# Patient Record
Sex: Female | Born: 1965 | Race: Black or African American | Hispanic: No | Marital: Single | Smoking: Never smoker
Health system: Southern US, Community
[De-identification: ages and names within clinical notes are randomized; demographics above are authoritative.]

## PROBLEM LIST (undated history)

## (undated) DIAGNOSIS — H209 Unspecified iridocyclitis: Secondary | ICD-10-CM

## (undated) DIAGNOSIS — T7840XA Allergy, unspecified, initial encounter: Secondary | ICD-10-CM

## (undated) DIAGNOSIS — S0300XA Dislocation of jaw, unspecified side, initial encounter: Secondary | ICD-10-CM

## (undated) DIAGNOSIS — I10 Essential (primary) hypertension: Secondary | ICD-10-CM

## (undated) DIAGNOSIS — N83209 Unspecified ovarian cyst, unspecified side: Secondary | ICD-10-CM

## (undated) DIAGNOSIS — A159 Respiratory tuberculosis unspecified: Secondary | ICD-10-CM

## (undated) DIAGNOSIS — K5289 Other specified noninfective gastroenteritis and colitis: Secondary | ICD-10-CM

## (undated) DIAGNOSIS — Z800 Family history of malignant neoplasm of digestive organs: Secondary | ICD-10-CM

## (undated) DIAGNOSIS — D509 Iron deficiency anemia, unspecified: Secondary | ICD-10-CM

## (undated) DIAGNOSIS — R7303 Prediabetes: Secondary | ICD-10-CM

## (undated) DIAGNOSIS — K5190 Ulcerative colitis, unspecified, without complications: Secondary | ICD-10-CM

## (undated) DIAGNOSIS — I517 Cardiomegaly: Secondary | ICD-10-CM

## (undated) DIAGNOSIS — U071 COVID-19: Secondary | ICD-10-CM

## (undated) DIAGNOSIS — E669 Obesity, unspecified: Secondary | ICD-10-CM

## (undated) DIAGNOSIS — E538 Deficiency of other specified B group vitamins: Secondary | ICD-10-CM

## (undated) HISTORY — PX: COLONOSCOPY: SHX174

## (undated) HISTORY — DX: Unspecified ovarian cyst, unspecified side: N83.209

## (undated) HISTORY — DX: Obesity, unspecified: E66.9

## (undated) HISTORY — DX: Allergy, unspecified, initial encounter: T78.40XA

## (undated) HISTORY — PX: BARTHOLIN GLAND CYST EXCISION: SHX565

## (undated) HISTORY — DX: Iron deficiency anemia, unspecified: D50.9

## (undated) HISTORY — DX: Essential (primary) hypertension: I10

## (undated) HISTORY — DX: Unspecified iridocyclitis: H20.9

## (undated) HISTORY — DX: Dislocation of jaw, unspecified side, initial encounter: S03.00XA

## (undated) HISTORY — PX: ACHILLES TENDON REPAIR: SUR1153

## (undated) HISTORY — DX: Family history of malignant neoplasm of digestive organs: Z80.0

## (undated) HISTORY — DX: Deficiency of other specified B group vitamins: E53.8

## (undated) HISTORY — DX: Ulcerative colitis, unspecified, without complications: K51.90

## (undated) HISTORY — DX: COVID-19: U07.1

## (undated) HISTORY — DX: Other specified noninfective gastroenteritis and colitis: K52.89

---

## 1990-04-16 HISTORY — PX: BARTHOLIN GLAND CYST EXCISION: SHX565

## 2000-10-07 ENCOUNTER — Other Ambulatory Visit: Admission: RE | Admit: 2000-10-07 | Discharge: 2000-10-07 | Payer: Self-pay | Admitting: Gastroenterology

## 2000-10-07 ENCOUNTER — Encounter (INDEPENDENT_AMBULATORY_CARE_PROVIDER_SITE_OTHER): Payer: Self-pay

## 2004-08-15 ENCOUNTER — Ambulatory Visit: Payer: Self-pay | Admitting: Gastroenterology

## 2004-09-13 ENCOUNTER — Ambulatory Visit: Payer: Self-pay | Admitting: Gastroenterology

## 2004-09-13 ENCOUNTER — Encounter (INDEPENDENT_AMBULATORY_CARE_PROVIDER_SITE_OTHER): Payer: Self-pay | Admitting: *Deleted

## 2004-10-26 ENCOUNTER — Ambulatory Visit: Payer: Self-pay | Admitting: Gastroenterology

## 2005-02-13 ENCOUNTER — Ambulatory Visit: Payer: Self-pay | Admitting: Gastroenterology

## 2006-02-22 ENCOUNTER — Ambulatory Visit: Payer: Self-pay | Admitting: Gastroenterology

## 2006-11-21 ENCOUNTER — Ambulatory Visit: Payer: Self-pay | Admitting: Gastroenterology

## 2007-01-17 ENCOUNTER — Ambulatory Visit: Payer: Self-pay | Admitting: Gastroenterology

## 2007-01-27 ENCOUNTER — Encounter: Payer: Self-pay | Admitting: Gastroenterology

## 2007-01-27 ENCOUNTER — Ambulatory Visit: Payer: Self-pay | Admitting: Gastroenterology

## 2007-01-27 DIAGNOSIS — K5190 Ulcerative colitis, unspecified, without complications: Secondary | ICD-10-CM

## 2007-02-27 ENCOUNTER — Ambulatory Visit: Payer: Self-pay | Admitting: Gastroenterology

## 2007-02-27 LAB — CONVERTED CEMR LAB
Angiotensin 1 Converting Enzyme: 37 units/L (ref 9–67)
Ferritin: 14.3 ng/mL (ref 10.0–291.0)
Folate: 7.7 ng/mL
Iron: 53 ug/dL (ref 42–145)
Saturation Ratios: 12.5 % — ABNORMAL LOW (ref 20.0–50.0)
Sed Rate: 33 mm/hr — ABNORMAL HIGH (ref 0–25)
Transferrin: 304 mg/dL (ref >=212.0)
Vitamin B-12: 222 pg/mL (ref 211–911)

## 2007-06-03 DIAGNOSIS — D509 Iron deficiency anemia, unspecified: Secondary | ICD-10-CM

## 2007-06-03 DIAGNOSIS — H209 Unspecified iridocyclitis: Secondary | ICD-10-CM

## 2007-06-03 DIAGNOSIS — D1803 Hemangioma of intra-abdominal structures: Secondary | ICD-10-CM | POA: Insufficient documentation

## 2007-06-03 DIAGNOSIS — K219 Gastro-esophageal reflux disease without esophagitis: Secondary | ICD-10-CM

## 2007-06-03 DIAGNOSIS — N83209 Unspecified ovarian cyst, unspecified side: Secondary | ICD-10-CM

## 2007-06-03 DIAGNOSIS — E669 Obesity, unspecified: Secondary | ICD-10-CM

## 2007-06-03 DIAGNOSIS — K5289 Other specified noninfective gastroenteritis and colitis: Secondary | ICD-10-CM | POA: Insufficient documentation

## 2007-06-03 DIAGNOSIS — E538 Deficiency of other specified B group vitamins: Secondary | ICD-10-CM

## 2007-08-18 ENCOUNTER — Telehealth: Payer: Self-pay | Admitting: Gastroenterology

## 2008-10-01 ENCOUNTER — Encounter: Payer: Self-pay | Admitting: Gastroenterology

## 2008-11-11 ENCOUNTER — Telehealth: Payer: Self-pay | Admitting: Gastroenterology

## 2008-11-30 ENCOUNTER — Ambulatory Visit: Payer: Self-pay | Admitting: Gastroenterology

## 2008-11-30 DIAGNOSIS — I10 Essential (primary) hypertension: Secondary | ICD-10-CM | POA: Insufficient documentation

## 2010-04-11 ENCOUNTER — Telehealth: Payer: Self-pay | Admitting: Gastroenterology

## 2010-04-11 ENCOUNTER — Encounter: Payer: Self-pay | Admitting: Gastroenterology

## 2010-04-28 ENCOUNTER — Ambulatory Visit
Admission: RE | Admit: 2010-04-28 | Discharge: 2010-04-28 | Payer: Self-pay | Source: Home / Self Care | Attending: Gastroenterology | Admitting: Gastroenterology

## 2010-04-28 ENCOUNTER — Other Ambulatory Visit: Payer: Self-pay | Admitting: Gastroenterology

## 2010-04-28 LAB — URINALYSIS, ROUTINE W REFLEX MICROSCOPIC
Bilirubin Urine: NEGATIVE
Hemoglobin, Urine: NEGATIVE
Ketones, ur: NEGATIVE
Leukocytes, UA: NEGATIVE
Nitrite: NEGATIVE
Specific Gravity, Urine: 1.01 (ref 1.000–1.030)
Total Protein, Urine: NEGATIVE
Urine Glucose: NEGATIVE
Urobilinogen, UA: 0.2 (ref 0.0–1.0)
pH: 7 (ref 5.0–8.0)

## 2010-05-18 NOTE — Assessment & Plan Note (Signed)
Summary: med refill---ch.    History of Present Illness Visit Type: Follow-up Visit Primary GI MD: Verl Blalock MD [place] Primary Provider: [name], MD Requesting Provider: na Chief Complaint: F/u for ulcerative colitis.  Pt states that she is doing great and denies any GI complaints  History of Present Illness:   ulcerative colitis in remission on Colazal 3 tablets twice a day. She denies any GI complaints or general medical problems. Extensive labs from Dr.McManus reviewed show a normal CBC and metabolic parameters. She denies any cardiovascular, pulmonary, genitourinary problems. She previously was B12 deficient at current levels of iron and B12 are normal.   GI Review of Systems      Denies abdominal pain, acid reflux, belching, bloating, chest pain, dysphagia with liquids, dysphagia with solids, heartburn, loss of appetite, nausea, vomiting, vomiting blood, weight loss, and  weight gain.        Denies anal fissure, black tarry stools, change in bowel habit, constipation, diarrhea, diverticulosis, fecal incontinence, heme positive stool, hemorrhoids, irritable bowel syndrome, jaundice, light color stool, liver problems, rectal bleeding, and  rectal pain.    Current Medications (verified): 1)  Balsalazide Disodium 750 Mg  Caps (Balsalazide Disodium) .... Take Three Tabs Two Times A Day....must Have Office Visit 2)  Amlodipine Besylate 5 Mg Tabs (Amlodipine Besylate) .... Take 1 Tab Once Daily 3)  Benazepril Hcl 40 Mg Tabs (Benazepril Hcl) .... Take 1 Tablet Once Daily 4)  Prenatal Vitamins 0.8 Mg Tabs (Prenatal Multivit-Min-Fe-Fa) .... Once Daily 5)  Lutera 0.1-20 Mg-Mcg Tabs (Levonorgestrel-Ethinyl Estrad) .... As Directed 6)  Omnipred 1 % Susp (Prednisolone Acetate) .... As Directed 7)  Bee Pollen 580 Mg Caps (Bee Pollen) .... One Capsule By Mouth Once Daily  Allergies (verified): 1)  ! Sulfa  Past History:  Past medical, surgical, family and social histories  (including risk factors) reviewed for relevance to current acute and chronic problems.  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 Bartholin Cyst Excision  Family History: Reviewed history from 11/30/2008 and no changes required. Family History of Diabetes: Father Family History of Heart Disease: Father Family History of Colon Cancer:Great Maternal Aunt   Social History: Reviewed history from 11/30/2008 and no changes required. 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  Alcohol Use - yes Daily Caffeine Use Illicit Drug Use - no  Review of Systems  The patient denies allergy/sinus, anemia, anxiety-new, arthritis/joint pain, back pain, blood in urine, breast changes/lumps, change in vision, confusion, cough, coughing up blood, depression-new, fainting, fatigue, fever, headaches-new, hearing problems, heart murmur, heart rhythm changes, itching, menstrual pain, muscle pains/cramps, night sweats, nosebleeds, pregnancy symptoms, shortness of breath, skin rash, sleeping problems, sore throat, swelling of feet/legs, swollen lymph glands, thirst - excessive , urination - excessive , urination changes/pain, urine leakage, vision changes, and voice change.    Vital Signs:  Patient profile:   45 year old female Height:      61 inches Weight:      294 pounds BMI:     55.75 BSA:     2.23 Pulse rate:   76 / minute Pulse rhythm:   regular BP sitting:   132 / 80  (left arm) Cuff size:   large  Vitals Entered By: Hope Pigeon CMA (April 28, 2010 10:57  AM)  Physical Exam  General:  Well developed, well nourished, no acute  distress.obese.  obese.   Head:  Normocephalic and atraumatic. Eyes:  PERRLA, no icterus.exam deferred to patient's ophthalmologist.  exam deferred to patient's ophthalmologist.   Abdomen:  Soft, nontender and nondistended. No masses, hepatosplenomegaly or hernias noted. Normal bowel sounds.Difficult exam per massive obesity but no definite organomegaly, masses or tenderness noted. Psych:  Alert and cooperative. Normal mood and affect.   Impression & Recommendations:  Problem # 1:  INFLAMMATORY BOWEL DISEASE (ICD-558.9) Assessment Improved Currently in remission for several years on stable doses of amino salicylates. Prescription for Colazal 3 tablets twice a day renewed, and we will check urinalysis to exclude any associated nephritis. She is due for followup colonoscopy in October 2013. She does have a family history of colon cancer. Last exam was unremarkable.  Problem # 2:  VITAMIN B12 DEFICIENCY (ICD-266.2) Assessment: Improved  Problem # 3:  EXOGENOUS OBESITY (ICD-278.00) Assessment: Unchanged  Not a candidate for bariatric surgery.  Problem # 4:  GERD (ICD-530.81) Assessment: Improved she is not on PPI therapy at this time and is asymptomatic.  Other Orders: TLB-Udip w/ Micro (81001-URINE)  Patient Instructions: 1)  Copy sent to : [name], MD 2)  Please go to the basement today for your labs.  3)  Your prescription(s) have been sent to you pharmacy.  4)  The medication list was reviewed and reconciled.  All changed / newly prescribed medications were explained.  A complete medication list was provided to the patient / caregiver. 5)  Colonoscopy in October of 2013. 6)  High Fiber, Low Fat  Healthy Eating Plan brochure given.  Prescriptions: BALSALAZIDE DISODIUM 750 MG  CAPS (BALSALAZIDE DISODIUM) Take three tabs two times a day  #180 x 11   Entered by:   Bernita Buffy CMA ([place])   Authorized by:   Sable Feil MD [facility]   Signed by:   Sable Feil MD [facility] on  04/28/2010   Method used:   Electronically to        [facility] [phone]* (retail)       [address], [place]  [address]       Ph: [phone]       Fax: [phone]   RxID:   [id number]

## 2010-05-18 NOTE — Progress Notes (Signed)
Summary: Medication refill   Phone Note Call from Patient Call back at Home Phone [phone]   Caller: Patient Call For: Dr. [name] Reason for Call: Refill Medication Summary of Call: Needs a refill on her Cyanocobalamine.Marland KitchenMarland KitchenMarland KitchenRos[place]...Marland KitchenMarland Kitchenappt. sch'd on 04-28-10 Initial call taken by: Webb Laws,  April 11, 2010 11:23 AM  Follow-up for Phone Call        neither numer in chart working an I am not sure what drug she wants refilled.  Bernita Buffy CMA Deborra Medina),  April 11, 2010 12:01 PM voicemail has not been set up. Bernita Buffy CMA Deborra Medina)  April 12, 2010 10:52 AM   rx sent, pt did not answer phone.  Follow-up by: Bernita Buffy CMA Deborra Medina),  April 14, 2010 12:22 PM    Prescriptions: BALSALAZIDE DISODIUM 750 MG  CAPS (BALSALAZIDE DISODIUM) Take three tabs two times a day....MUST HAVE OFFICE VISIT  #180 x 0   Entered by:   Bernita Buffy CMA ([place])   Authorized by:   Sable Feil MD [facility]   Signed by:   Bernita Buffy CMA ([place]) on 04/14/2010   Method used:   Electronically to        [facility] [phone]* (retail)       [address], [place]  [address]       Ph: [phone]       Fax: [phone]   RxID:   [id number]

## 2010-08-01 ENCOUNTER — Other Ambulatory Visit: Payer: Self-pay | Admitting: Gastroenterology

## 2010-08-29 NOTE — Assessment & Plan Note (Signed)
[place] OFFICE NOTE   PRESLYN, WARR                        MRN:          [id number]  DATE:11/21/2006                            DOB:          01-10-1966    I have not seen Vicki Burton for the last year.  She recently had some  asymptomatic rectal bleeding, and saw Dr. Thurnell Garbe, who checked her blood  work, which showed a slight anemia with a hemoglobin of 10.9, but normal  normocytic indices.  She had slight eosinophilia of 13%, and elevated  sed rate of 74.  He placed her on prednisone 10 mg a day while  continuing her Colazal 750 mg 3 twice a day.   Patient's rectal bleeding has quit, but she has some rectal discomfort.  She is having no diarrhea or abdominal pain.   She weighs 276 pounds.  Blood pressure 112/78.  Pulse was 80 and  regular.  She does have a partially thrombosed external hemorrhoid with a large  clot extruding from the lumen.  I did not perform digital rectal exam.   ASSESSMENT:  She states she seems to be in remission with her colitis.  I suspect her rectal bleeding was from her hemorrhoid.  Her elevated is  of some concern, as is her anemia.   RECOMMENDATIONS:  1. Local anal care with twice-a-day sitz baths, AnaMantle HC cream,      and Xylocaine locally 2 to 2 times a day as tolerated.  2. Benefiber as a stool softener.  3. Followup colonoscopy in several weeks' time after hemorrhoid is      healed.  4. I have asked her to stop prednisone at this time, since she is      fairly asymptomatic, and I do not want to embark on steroid therapy      unless absolutely needed.     [name]. [name], MD, [name], [place]  Electronically Signed    DRP/MedQ  DD: 11/21/2006  DT: 11/21/2006  Job #: [id number]   cc:   [name]

## 2010-08-29 NOTE — Assessment & Plan Note (Signed)
[place] OFFICE NOTE   Vicki Burton, Vicki Burton                        MRN:          [id number]  DATE:02/27/2007                            DOB:          1966/03/21    Vicki Burton is a 45 year old African-American female who has chronic  ulcerative colitis which is in remission as confirmed by recent  colonoscopy on January 27, 2007.  This included biopsy of the ileum and  colon.  She is having no gastrointestinal difficulties taking Colazal 3  tablets twice a day along with daily Benefiber.   Her appetite is good and her weight is stable.  Her only other medical  problems are iritis which is being treated by Dr. Dawna Part with steroid  drops.  She has no other history of extra colonic manifestations of her  inflammatory bowel disease such as arthropathy, recurrent skin rashes,  mouth sores, etc.  There is no history of sarcoidosis.  She apparently  has had a normal chest x-ray.  Her only other medications at this time  are Nu-iron daily and Alesse birth control pills.   She is a healthy-appearing black female in no distress appearing her  stated age.  She weighs 278 pounds, her blood pressure 122/70 and pulse was 72 and  regular.  General physical exam is not repeated at this time.   ASSESSMENT:  1. Ulcerative colitis, in remission on aminosalicylate therapy.  2. Iritis, possibly related to inflammatory bowel disease, rule out      other inflammatory conditions per her repeatedly elevated sed rate      which is 74 recently.  3. Chronic iron deficiency from probable gynecological and      gastrointestinal source.  4. History of SULFA allergy, but she has been able to tolerate      aminosalicylates without difficulty.   RECOMMENDATIONS:  1. Once her Colazal prescription runs out I will switch her to Lialda      2.4 g once a day.  2. Check followup iron levels, B12, and angiotensin-converting enzyme      level  (ACE).  3. Gastrointestinal followup at 6 months intervals or otherwise as per      symptomatology.  4. Continued ophthalmologic followup with Dr. Dawna Part.     [name]. [name], MD, [name], [place]  Electronically Signed    DRP/MedQ  DD: 02/27/2007  DT: 02/27/2007  Job #: [id number]   cc:   Annie Sable

## 2010-09-01 NOTE — Assessment & Plan Note (Signed)
[place] OFFICE NOTE   Burton, Vicki                        MRN:          [id number]  DATE:02/22/2006                            DOB:          12-21-1965    PROBLEMS:  1. Chronic left-sided ulcerative colitis.  2. Gastroesophageal reflux disease.  3. Chronic anemia.   HISTORY:  Vicki Burton comes in today for one-year followup.  Her colitis has been  in remission and has remained in remission over the past year on maintenance  dose of Colazal 750 three p.o. b.i.d.  She also has a chronic iron  deficiency anemia, which has responded to Nu-Iron in the past.  She says she  has not had any symptoms of colitis at all.  Her bowel movements have been  normal.  She denies any abdominal pain or cramping, no melena or  hematochezia.  She admits that she had gone off the Nu-Iron and then had  labs at her primary care doctor's about two weeks ago and was told that she  was mildly anemic, has gone back on Nu-Iron twice daily.  She also had been  having intermittent reflux symptoms and then, a few weeks ago, had a one-  week stretch of burning and indigestion in the esophagus.  She had run out  of Nexium.  She says she feels a bit better now, but has not been on a PPI  regularly.  She denies any dysphagia or odynophagia.   Currently, over the past five days, she has had upper respiratory infection  symptoms with some productive cough, no fever or chills, some sinus drainage  and headache.   CURRENT MEDICATIONS:  1. Colazal 750 three p.o. b.i.d.  2. Numbness-Iron one p.o. b.i.d.  3. Multivitamin daily.  4. Fibercon daily.  5. Nexium 40 mg daily, which she has been off.   ALLERGIES:  SULFA, which causes hives and a rash.   EXAM:  GENERAL:  Well-developed, African-American female, in no acute  distress.  VITAL SIGNS:  Weight is 264, blood pressure 130/80, pulse is 68.  CARDIOVASCULAR:  Regular rate and rhythm  with S1 and S2.  No murmur, rub or  gallop.  PULMONARY:  Clear to A&P.  ABDOMEN:  Soft and nontender.  There is no palpable mass or  hepatosplenomegaly.  Bowel sounds are active.  RECTAL EXAM:  Not done at this time.   IMPRESSION:  1. Left-sided ulcerative colitis, in remission.  2. GERD, stable on medication.  3. Chronic iron deficiency anemia.   PLAN:  Refill Colazal, Nexium and Nu-Iron at current dosages and have asked  the patient to get her labs from Dr. Thurnell Garbe' office faxed to [place].  We will  see her back in one year or sooner, p.r.n., and certainly should see her if  she has any exacerbation of her colitis.      [name], PA-C  Electronically Signed      [name]. [name], MD, [name], [name]  Electronically Signed   AE/MedQ  DD: 02/22/2006  DT: 02/22/2006  Job #: [id number]   cc:  [name]

## 2011-07-11 ENCOUNTER — Other Ambulatory Visit: Payer: Self-pay | Admitting: Gastroenterology

## 2011-07-16 ENCOUNTER — Other Ambulatory Visit: Payer: Self-pay | Admitting: Gastroenterology

## 2011-07-16 MED ORDER — BALSALAZIDE DISODIUM 750 MG PO CAPS: ORAL_CAPSULE | ORAL | Status: DC

## 2011-07-16 NOTE — Telephone Encounter (Signed)
rx sent

## 2011-07-23 ENCOUNTER — Encounter: Payer: Self-pay | Admitting: *Deleted

## 2011-07-26 ENCOUNTER — Encounter: Payer: Self-pay | Admitting: Gastroenterology

## 2011-07-26 ENCOUNTER — Ambulatory Visit (INDEPENDENT_AMBULATORY_CARE_PROVIDER_SITE_OTHER): Payer: BC Managed Care – PPO | Admitting: Gastroenterology

## 2011-07-26 VITALS — BP 132/72 | HR 64 | Ht 61.0 in | Wt 298.2 lb

## 2011-07-26 DIAGNOSIS — E538 Deficiency of other specified B group vitamins: Secondary | ICD-10-CM

## 2011-07-26 DIAGNOSIS — K5190 Ulcerative colitis, unspecified, without complications: Secondary | ICD-10-CM

## 2011-07-26 DIAGNOSIS — R197 Diarrhea, unspecified: Secondary | ICD-10-CM

## 2011-07-26 MED ORDER — CYANOCOBALAMIN 500 MCG/0.1ML NA SOLN: NASAL | Status: DC

## 2011-07-26 MED ORDER — SACCHAROMYCES BOULARDII 250 MG PO CAPS: 250.0000 mg | ORAL_CAPSULE | Freq: Two times a day (BID) | ORAL | Status: AC

## 2011-07-26 NOTE — Patient Instructions (Signed)
Please go to the basement today for your labs.  Take Florastor one tablet by mouth twice a day for 2 weeks, rx sent. Do Nascobal one spray, one nostril, once a week, samples and discount card given, rx sent.

## 2011-07-26 NOTE — Progress Notes (Signed)
This is a 46 year old African American female with greater than 10 years of ulcerative colitis in remission on oral aminosalicylate. Last colonoscopy was 5 years ago. She recently has had a flare of her diarrhea, cramping, and a small amount of rectal bleeding all alleviated by change in her blood pressure medication allegedly. However, she has been on a series of antibiotics cause recurrent sinusitis over the last several months. She currently is improving and has minimal symptomatology. She denies systemic complaints, upper GI or hepatobiliary problems. Patient is already die without food intolerances.  Current Medications, Allergies, Past Medical History, Past Surgical History, Family History and Social History were reviewed in [employer] record.  Pertinent Review of Systems Negative   Physical Exam: Obese patient in no acute distress. Blood pressure 132/72, pulse 64 and regular, and weight 298 pounds the BMI 56.34. Her abdomen is obese with Aldactone organomegaly, masses or tenderness. Bowel sounds are nonobstructed. Patient refused rectal exam. Mental status is normal.    Assessment and Plan: Rule out C. difficile infection versus post antibiotic flare of her IBS. I have asked her to return stool for C. difficile toxin, have placed her on Florstar twice a day for 2 weeks, and we will see she does symptomaticly  Continuing Colazal 750 mg 3 tablets twice a day. Review of labs from 325 shows a normal CBC and metabolic profile. The patient is B12 deficient, and we have placed her on nasal B12 weekly since there is a shortage of the parenteral medication. Encounter Diagnoses  Name Primary?  . B12 deficiency Yes  . Diarrhea

## 2011-08-01 ENCOUNTER — Other Ambulatory Visit: Payer: BC Managed Care – PPO

## 2011-08-01 DIAGNOSIS — R197 Diarrhea, unspecified: Secondary | ICD-10-CM

## 2011-08-03 LAB — CLOSTRIDIUM DIFFICILE BY PCR: Toxigenic C. Difficile by PCR: NOT DETECTED

## 2011-09-27 ENCOUNTER — Telehealth: Payer: Self-pay | Admitting: Gastroenterology

## 2011-09-28 MED ORDER — BALSALAZIDE DISODIUM 750 MG PO CAPS: ORAL_CAPSULE | ORAL | Status: DC

## 2011-09-28 NOTE — Telephone Encounter (Signed)
Spoke to pt and I never received a rx for the drug but I sent the rx

## 2012-10-02 ENCOUNTER — Other Ambulatory Visit: Payer: Self-pay | Admitting: Gastroenterology

## 2012-10-02 NOTE — Telephone Encounter (Signed)
PATIENT NEEDS OFFICE VISIT FOR FURTHER REFILLS

## 2012-10-29 ENCOUNTER — Encounter: Payer: Self-pay | Admitting: Gastroenterology

## 2012-11-03 ENCOUNTER — Other Ambulatory Visit: Payer: Self-pay | Admitting: Gastroenterology

## 2012-11-06 ENCOUNTER — Ambulatory Visit (INDEPENDENT_AMBULATORY_CARE_PROVIDER_SITE_OTHER): Payer: BC Managed Care – PPO | Admitting: Gastroenterology

## 2012-11-06 ENCOUNTER — Encounter: Payer: Self-pay | Admitting: Gastroenterology

## 2012-11-06 VITALS — BP 158/80 | HR 73 | Ht 60.0 in | Wt 297.0 lb

## 2012-11-06 DIAGNOSIS — M7660 Achilles tendinitis, unspecified leg: Secondary | ICD-10-CM

## 2012-11-06 DIAGNOSIS — M7662 Achilles tendinitis, left leg: Secondary | ICD-10-CM

## 2012-11-06 DIAGNOSIS — K5190 Ulcerative colitis, unspecified, without complications: Secondary | ICD-10-CM

## 2012-11-06 MED ORDER — TRAMADOL HCL 50 MG PO TABS: ORAL_TABLET | ORAL | Status: DC

## 2012-11-06 MED ORDER — BALSALAZIDE DISODIUM 750 MG PO CAPS: ORAL_CAPSULE | ORAL | Status: DC

## 2012-11-06 NOTE — Patient Instructions (Addendum)
We have sent the following medications to your pharmacy for you to pick up at your convenience: Tramadol Colozal  Discontinue Ibuprofen (you are taking tramadol in its place)  Please follow up with Dr [name] in 1 year.  CC: Dr Martie Lee

## 2012-11-06 NOTE — Progress Notes (Signed)
This is a 47 year old African American female who has had ulcerative colitis for over 2 years.  She currently is in remission on Colazal 750 mg 3 tablets twice a day.  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  She denies abdominal pain, upper GI or hepatobiliary or other general medical problems.  She suffers from obesity, and is been unable to exercise because of her orthopedic injury.  Current Medications, Allergies, Past Medical History, Past Surgical History, Family History and Social History were reviewed in [employer] record.  ROS: All systems were reviewed and are negative unless otherwise stated in the HPI.          Physical Exam: Blood pressure 158/80, pulse 73 and regular and weight 297 with a BMI of 58.  Her abdomen is very obese but I cannot appreciate any definite organomegaly, masses or tenderness.  Her left foot is in a orthopedic boot.  Mental status is normal    Assessment and Plan: Chronic ulcerative colitis in a morbidly obese patient on adequate doses of my mouth amino salicylates.  She does use when necessary Rowasa enemas and Canasa suppositories as needed She appears to be doing well her current doses of amino salicylates which I have asked her to continue.  We will have Dr. Martie Lee in [place] send [place] a copy of her lab tests for review.  I thoroughly reevaluate her in a year after she has lost weight so that we can safely do her screening colonoscopy.  I have asked her to avoid all NSAIDs and prescribed tramadol 50 mg every 6-8 hours for pain so that she can stop NSAID use which is flaring her colitis. No diagnosis found.

## 2013-01-02 ENCOUNTER — Telehealth: Payer: Self-pay | Admitting: Gastroenterology

## 2013-01-02 MED ORDER — MESALAMINE 4 G RE ENEM: 4.0000 g | ENEMA | Freq: Every day | RECTAL | Status: DC

## 2013-01-02 NOTE — Telephone Encounter (Signed)
Pt with hx of UC last seen 11/06/12 as a yearly f/u. She has been in remission for years; on Colazal 750 mg BID and she uses Rowasa or Canasa for flares. Pt reports on Monday she had a temp of 101.2 with chills and diarrhea. She was better Tuesday and Wednesday she started a routine of mild cramping after meals followed by diarrhea after every meal. She is still doing this and he stools go from loose to semi formed to watery and then back to loose. 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 She states this is not a normal flare for her; usually with a flare she has bloody diarrhea with strong cramps. Offered her an appt today and/or informed her she can monitor her s&s and call over the weekend. She reports usually with a flare she uses Rowasa enemas ans Dr [name] had stated at her f/u. She asks if we can order Rowasa in case she worsens over the weekend? OK to order? Thanks.

## 2013-01-02 NOTE — Addendum Note (Signed)
Addended by: Lance Morin on: 01/02/2013 02:24 PM   Modules accepted: Orders

## 2013-01-02 NOTE — Telephone Encounter (Signed)
See later encounter

## 2013-01-02 NOTE — Telephone Encounter (Signed)
Informed pt of Dr Vena Rua advice/orders. Pt is in [facility] and is >45 minutes away. 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 She will monitor her condition and eat better; bland diet and the BRAT diet. States she had broth and crackers at lunch and so far no cramping or diarrhea. Called the Rowasa in case she worsened over the weekend. Pt stated understanding.

## 2013-01-02 NOTE — Telephone Encounter (Signed)
Closed in error.

## 2013-01-02 NOTE — Telephone Encounter (Signed)
Would recommend gi pathogen panel today Okay for rowasa reorder, low risk, but we do not want to miss an infectious process Needs to be seen in urgent visit if not improving, er over the weekend if necessary

## 2013-03-07 ENCOUNTER — Other Ambulatory Visit: Payer: Self-pay | Admitting: Gastroenterology

## 2013-04-07 ENCOUNTER — Encounter: Payer: Self-pay | Admitting: Gastroenterology

## 2013-07-24 ENCOUNTER — Ambulatory Visit (INDEPENDENT_AMBULATORY_CARE_PROVIDER_SITE_OTHER): Payer: BC Managed Care – PPO | Admitting: Internal Medicine

## 2013-07-24 ENCOUNTER — Encounter: Payer: Self-pay | Admitting: Internal Medicine

## 2013-07-24 ENCOUNTER — Other Ambulatory Visit (INDEPENDENT_AMBULATORY_CARE_PROVIDER_SITE_OTHER): Payer: BC Managed Care – PPO

## 2013-07-24 VITALS — BP 128/82 | HR 80 | Ht 60.0 in | Wt 292.4 lb

## 2013-07-24 DIAGNOSIS — K5190 Ulcerative colitis, unspecified, without complications: Secondary | ICD-10-CM

## 2013-07-24 LAB — CBC
HCT: 37.3 % (ref 36.0–46.0)
HEMOGLOBIN: 12.4 g/dL (ref 12.0–15.0)
MCHC: 33.3 g/dL (ref 30.0–36.0)
MCV: 88.3 fl (ref 78.0–100.0)
Platelets: 380 10*3/uL (ref 150.0–400.0)
RBC: 4.22 Mil/uL (ref 3.87–5.11)
RDW: 15.8 % — AB (ref 11.5–14.6)
WBC: 7.4 10*3/uL (ref 4.5–10.5)

## 2013-07-24 LAB — COMPREHENSIVE METABOLIC PANEL
ALBUMIN: 3.5 g/dL (ref 3.5–5.2)
ALT: 15 U/L (ref 0–35)
AST: 20 U/L (ref 0–37)
Alkaline Phosphatase: 58 U/L (ref 39–117)
BUN: 10 mg/dL (ref 6–23)
CALCIUM: 9.3 mg/dL (ref 8.4–10.5)
CHLORIDE: 106 meq/L (ref 96–112)
CO2: 29 meq/L (ref 19–32)
CREATININE: 0.9 mg/dL (ref 0.4–1.2)
GFR: 91.04 mL/min (ref >60.00)
Glucose, Bld: 96 mg/dL (ref 70–99)
Potassium: 3.8 mEq/L (ref 3.5–5.1)
Sodium: 139 mEq/L (ref 135–145)
TOTAL PROTEIN: 7.6 g/dL (ref 6.0–8.3)
Total Bilirubin: 0.4 mg/dL (ref 0.3–1.2)

## 2013-07-24 LAB — VITAMIN B12: Vitamin B-12: 455 pg/mL (ref 211–911)

## 2013-07-24 LAB — FOLATE: Folate: 21.8 ng/mL (ref >5.9)

## 2013-07-24 MED ORDER — BALSALAZIDE DISODIUM 750 MG PO CAPS: 2250.0000 mg | ORAL_CAPSULE | Freq: Two times a day (BID) | ORAL | Status: DC

## 2013-07-24 NOTE — Patient Instructions (Addendum)
Your physician has requested that you go to the basement for lab work before leaving today.  Continue taking balsalazide (COLAZAL) 750 MG capsule; 3 capsules twice a day.  We will call you in late June to schedule you September Colonoscopy

## 2013-07-24 NOTE — Progress Notes (Signed)
Patient ID: Vicki Burton, female   DOB: 1965-12-20, 48 y.o.   MRN: [id number] HPI: Vicki Burton is a 48 yo female with past medical history ulcerative colitis maintained on balsalazide who is followed for years by Dr. Verl Blalock prior to his retirement. She is here alone today to establish care with me. She reports she is feeling well and denies colitis symptoms. She reports she has a normal formed bowel movement daily without blood in her stool or melena. No abdominal pain. No tenesmus. Very rare nausea without vomiting. This is improved by eating something salty. No weight loss. No hepatobiliary complaints. No rashes or eye complaints. She does take balsalazide 2.25 g twice daily. She in the past had used Rowasa enemas but has not used this in quite a long time. No family history of IBD. No previous colon polyps that she is aware of.  Last colonoscopy per the chart was 2008 though she feels it was more recent  Past Medical History  Diagnosis Date  . Unspecified essential hypertension   . Hepatic hemangioma   . Ovarian cyst   . Vitamin B12 deficiency   . Obesity, unspecified   . Esophageal reflux   . Iron deficiency anemia, unspecified   . Other and unspecified noninfectious gastroenteritis and colitis(558.9)   . Unspecified iridocyclitis   . Ulcerative colitis, unspecified   . Family history of malignant neoplasm of gastrointestinal tract     Past Surgical History  Procedure Laterality Date  . Bartholin gland cyst excision      Current Outpatient Prescriptions  Medication Sig Dispense Refill  . balsalazide (COLAZAL) 750 MG capsule Take 3 capsules (2,250 mg total) by mouth 2 (two) times daily.  180 capsule  3  . Biotin 1 MG CAPS Take by mouth.      . cetirizine (ZYRTEC) 10 MG tablet Take 10 mg by mouth daily.      Marland Kitchen diltiazem (DILACOR XR) 240 MG 24 hr capsule Take 240 mg by mouth daily.      Marland Kitchen levonorgestrel-ethinyl estradiol (AVIANE,ALESSE,LESSINA) 0.1-20 MG-MCG tablet Take 1 tablet  by mouth daily.      . mesalamine (ROWASA) 4 G enema Place 60 mLs (4 g total) rectally at bedtime.  10 Bottle  0  . nitroGLYCERIN (NITRO-DUR) 0.4 mg/hr patch Place 0.4 mg onto the skin daily. Not for heart    Used for achilles heel      . prednisoLONE acetate (PRED FORTE) 1 % ophthalmic suspension Place 1 drop into both eyes as directed.      . Prenatal Vit-Fe Fumarate-FA (MULTIVITAMIN-PRENATAL) 27-0.8 MG TABS Take 1 tablet by mouth daily.      . traMADol (ULTRAM) 50 MG tablet Take 1 tablet by mouth every 6-8 hours as needed.  65 tablet  3   No current facility-administered medications for this visit.    Allergies  Allergen Reactions  . Sulfonamide Derivatives     Family History  Problem Relation Age of Onset  . Diabetes Father   . Heart disease Father   . Colon cancer Other     maternal great Aunt  . Dementia Mother     History  Substance Use Topics  . Smoking status: Never Smoker   . Smokeless tobacco: Never Used  . Alcohol Use: No    ROS: As per history of present illness, otherwise negative  BP 128/82  Pulse 80  Ht 5' (1.524 m)  Wt 292 lb 6.4 oz (132.632 kg)  BMI 57.11 kg/m2  SpO2 98% Constitutional: Well-developed and well-nourished. No distress. HEENT: Normocephalic and atraumatic. Oropharynx is clear and moist. No oropharyngeal exudate. Conjunctivae are normal.  No scleral icterus. Neck: Neck supple. Trachea midline. Cardiovascular: Normal rate, regular rhythm and intact distal pulses. No M/R/G Pulmonary/chest: Effort normal and breath sounds normal. No wheezing, rales or rhonchi. Abdominal: Soft, nontender, nondistended. Bowel sounds active throughout. There are no masses palpable. No hepatosplenomegaly. Extremities: no clubbing, cyanosis, or edema Lymphadenopathy: No cervical adenopathy noted. Neurological: Alert and oriented to person place and time. Skin: Skin is warm and dry. No rashes noted. Psychiatric: Normal mood and affect. Behavior is  normal.  RELEVANT LABS AND IMAGING: CBC    Component Value Date/Time   WBC 7.4 07/24/2013 1536   RBC 4.22 07/24/2013 1536   HGB 12.4 07/24/2013 1536   HCT 37.3 07/24/2013 1536   PLT 380.0 07/24/2013 1536   MCV 88.3 07/24/2013 1536   MCHC 33.3 07/24/2013 1536   RDW 15.8* 07/24/2013 1536    CMP     Component Value Date/Time   NA 139 07/24/2013 1536   K 3.8 07/24/2013 1536   CL 106 07/24/2013 1536   CO2 29 07/24/2013 1536   GLUCOSE 96 07/24/2013 1536   BUN 10 07/24/2013 1536   CREATININE 0.9 07/24/2013 1536   CALCIUM 9.3 07/24/2013 1536   PROT 7.6 07/24/2013 1536   ALBUMIN 3.5 07/24/2013 1536   AST 20 07/24/2013 1536   ALT 15 07/24/2013 1536   ALKPHOS 58 07/24/2013 1536   BILITOT 0.4 07/24/2013 1536    ASSESSMENT/PLAN: 48 year old with past mental history of ulcerative colitis in clinical remission  1.  UC -- diagnosed in 2001 with documentation of disease from the rectum to hepatic flexure.  Clinical remission and therefore we will continue balsalazide 750 mg, 3 capsules twice daily.  I have recommended repeat colonoscopy at this time and she requests that we do this later in the year after an upcoming orthopedic surgery. She asked for this to be done in September, which we will try to accommodate. Labs today to include CBC, CMP, B12, folate and vitamin D.  I asked that she notify me immediately if she develops any signs of acute flare. She voices understanding. I reminded her to avoid NSAIDs as much as possible.  We'll plan surveillance biopsies given that she has had UC for 14 years

## 2013-07-25 LAB — VITAMIN D 25 HYDROXY (VIT D DEFICIENCY, FRACTURES): Vit D, 25-Hydroxy: 30 ng/mL (ref 30–89)

## 2013-10-05 ENCOUNTER — Other Ambulatory Visit: Payer: Self-pay | Admitting: Gastroenterology

## 2013-11-27 ENCOUNTER — Encounter: Payer: Self-pay | Admitting: Internal Medicine

## 2013-12-11 ENCOUNTER — Encounter: Payer: Self-pay | Admitting: Internal Medicine

## 2013-12-11 ENCOUNTER — Other Ambulatory Visit: Payer: Self-pay | Admitting: Internal Medicine

## 2014-01-26 ENCOUNTER — Telehealth: Payer: Self-pay | Admitting: *Deleted

## 2014-01-26 ENCOUNTER — Other Ambulatory Visit: Payer: Self-pay

## 2014-01-26 DIAGNOSIS — K529 Noninfective gastroenteritis and colitis, unspecified: Secondary | ICD-10-CM

## 2014-01-26 NOTE — Telephone Encounter (Signed)
Patient needs hospital colonoscopy With MAC for IBD surveillance Please attempt to schedule at [facility] on 02/18/2014 to follow my already scheduled EGD

## 2014-01-26 NOTE — Telephone Encounter (Signed)
Dr Hilarie Fredrickson,  I was prepping this pt's chart this am and see that she has a BMI of 57.2. You last saw her 07-24-2013 in the office and she did have a high BMI then as well. In your note she asked for her repeat colon for UC be later in the year and has been scheduled for 10-30 Friday at 130 pm.  Is it okay for her to be a direct colon at  [facility] or do you need to see her again in the office?  Thanks for your time  Marijean Niemann

## 2014-01-26 NOTE — Telephone Encounter (Signed)
Unable to do procedure on 02/18/14. Colon with mac scheduled at [facility] 02/19/14@10am . Pt to arrive there at 8:30am. Pt coming for previsit tomorrow. Left message for pt to call back.

## 2014-01-27 ENCOUNTER — Ambulatory Visit (AMBULATORY_SURGERY_CENTER): Payer: Self-pay | Admitting: *Deleted

## 2014-01-27 VITALS — Ht 60.0 in | Wt 299.4 lb

## 2014-01-27 DIAGNOSIS — K5190 Ulcerative colitis, unspecified, without complications: Secondary | ICD-10-CM

## 2014-01-27 NOTE — Progress Notes (Signed)
No egg or soy allergy. ewm No problems with past sedation. ewm No diet pills. ewm No blood thinners. ewm Pt declined emmi video. ewm Pt states with l;ast colon she vomited her movi prep and couldn't keep any down. Pt given miralax prep instructions. ewm

## 2014-01-27 NOTE — Telephone Encounter (Signed)
Pt in today for Pv, aware of hospital date time. Instructions given. ewm  Vicki Burton PV

## 2014-02-01 ENCOUNTER — Encounter (HOSPITAL_COMMUNITY): Payer: Self-pay | Admitting: Pharmacy Technician

## 2014-02-08 ENCOUNTER — Encounter (HOSPITAL_COMMUNITY): Payer: Self-pay | Admitting: *Deleted

## 2014-02-10 ENCOUNTER — Other Ambulatory Visit: Payer: Self-pay | Admitting: Internal Medicine

## 2014-02-12 ENCOUNTER — Encounter: Payer: BC Managed Care – PPO | Admitting: Internal Medicine

## 2014-02-19 ENCOUNTER — Encounter (HOSPITAL_COMMUNITY): Payer: Self-pay | Admitting: *Deleted

## 2014-02-19 ENCOUNTER — Ambulatory Visit (HOSPITAL_COMMUNITY): Payer: BC Managed Care – PPO | Admitting: Anesthesiology

## 2014-02-19 ENCOUNTER — Encounter (HOSPITAL_COMMUNITY)
Admission: RE | Disposition: A | Discharge status: Home / Self Care | Payer: BC Managed Care – PPO | Source: Ambulatory Visit | Attending: Internal Medicine

## 2014-02-19 ENCOUNTER — Ambulatory Visit (HOSPITAL_COMMUNITY)
Admission: RE | Admit: 2014-02-19 | Discharge: 2014-02-19 | Disposition: A | Discharge status: Home / Self Care | Payer: BC Managed Care – PPO | Source: Ambulatory Visit | Attending: Internal Medicine | Admitting: Internal Medicine

## 2014-02-19 DIAGNOSIS — Z882 Allergy status to sulfonamides status: Secondary | ICD-10-CM | POA: Diagnosis not present

## 2014-02-19 DIAGNOSIS — E669 Obesity, unspecified: Secondary | ICD-10-CM | POA: Diagnosis not present

## 2014-02-19 DIAGNOSIS — K529 Noninfective gastroenteritis and colitis, unspecified: Secondary | ICD-10-CM

## 2014-02-19 DIAGNOSIS — N8320 Unspecified ovarian cysts: Secondary | ICD-10-CM | POA: Insufficient documentation

## 2014-02-19 DIAGNOSIS — Z1211 Encounter for screening for malignant neoplasm of colon: Secondary | ICD-10-CM | POA: Insufficient documentation

## 2014-02-19 DIAGNOSIS — I10 Essential (primary) hypertension: Secondary | ICD-10-CM | POA: Diagnosis not present

## 2014-02-19 DIAGNOSIS — D1809 Hemangioma of other sites: Secondary | ICD-10-CM | POA: Diagnosis not present

## 2014-02-19 DIAGNOSIS — H209 Unspecified iridocyclitis: Secondary | ICD-10-CM | POA: Diagnosis not present

## 2014-02-19 DIAGNOSIS — Z800 Family history of malignant neoplasm of digestive organs: Secondary | ICD-10-CM | POA: Diagnosis not present

## 2014-02-19 DIAGNOSIS — D509 Iron deficiency anemia, unspecified: Secondary | ICD-10-CM | POA: Insufficient documentation

## 2014-02-19 DIAGNOSIS — K219 Gastro-esophageal reflux disease without esophagitis: Secondary | ICD-10-CM | POA: Insufficient documentation

## 2014-02-19 DIAGNOSIS — E538 Deficiency of other specified B group vitamins: Secondary | ICD-10-CM | POA: Insufficient documentation

## 2014-02-19 DIAGNOSIS — K5190 Ulcerative colitis, unspecified, without complications: Secondary | ICD-10-CM | POA: Diagnosis not present

## 2014-02-19 DIAGNOSIS — Z6841 Body Mass Index (BMI) 40.0 and over, adult: Secondary | ICD-10-CM | POA: Insufficient documentation

## 2014-02-19 DIAGNOSIS — K6389 Other specified diseases of intestine: Secondary | ICD-10-CM

## 2014-02-19 HISTORY — PX: COLONOSCOPY: SHX5424

## 2014-02-19 SURGERY — COLONOSCOPY
Anesthesia: General

## 2014-02-19 MED ORDER — PROPOFOL 10 MG/ML IV BOLUS
INTRAVENOUS | Status: AC
  Filled 2014-02-19: qty 20

## 2014-02-19 MED ORDER — PROPOFOL INFUSION 10 MG/ML OPTIME
INTRAVENOUS | Status: DC | PRN
  Administered 2014-02-19: 100 ug/kg/min via INTRAVENOUS

## 2014-02-19 MED ORDER — LACTATED RINGERS IV SOLN
INTRAVENOUS | Status: DC
  Administered 2014-02-19: 09:00:00 via INTRAVENOUS

## 2014-02-19 MED ORDER — PROPOFOL 10 MG/ML IV BOLUS
INTRAVENOUS | Status: DC | PRN
  Administered 2014-02-19 (×5): 20 mg via INTRAVENOUS
  Administered 2014-02-19: 40 mg via INTRAVENOUS
  Administered 2014-02-19: 20 mg via INTRAVENOUS

## 2014-02-19 MED ORDER — SODIUM CHLORIDE 0.9 % IV SOLN
INTRAVENOUS | Status: DC
Start: 2014-02-19 — End: 2014-02-19

## 2014-02-19 NOTE — H&P (Signed)
  HPI: Vicki Burton is a 48 year old female with a past medical history of ulcerative colitis diagnosed in 2001 with initial disease to the hepatic flexure maintained on balsalazide who presents for surveillance colonoscopy. She reports she is doing well. She tolerated the prep well. She is having 2 formed bowel movements daily without blood in her stool or melena. She denies abdominal pain. Good appetite. No fevers or chills. Last colonoscopy 2008  Past Medical History  Diagnosis Date  . Unspecified essential hypertension   . Hepatic hemangioma   . Ovarian cyst   . Vitamin B12 deficiency   . Obesity, unspecified   . Esophageal reflux   . Iron deficiency anemia, unspecified   . Other and unspecified noninfectious gastroenteritis and colitis(558.9)   . Unspecified iridocyclitis   . Ulcerative colitis, unspecified   . Family history of malignant neoplasm of gastrointestinal tract     maternal great aunt    Past Surgical History  Procedure Laterality Date  . Bartholin gland cyst excision    . Colonoscopy    . Achilles tendon repair Left      (Not in an outpatient encounter)  Allergies  Allergen Reactions  . Amlodipine Swelling    Lips/face  . Lisinopril   . Sulfonamide Derivatives     Family History  Problem Relation Age of Onset  . Diabetes Father   . Heart disease Father   . Colon cancer Other     maternal great Aunt  . Dementia Mother   . Rectal cancer Neg Hx   . Stomach cancer Neg Hx     History  Substance Use Topics  . Smoking status: Never Smoker   . Smokeless tobacco: Never Used  . Alcohol Use: No    ROS: As per history of present illness, otherwise negative   Gen: awake, alert, NAD HEENT: anicteric, op clear CV: RRR, no mrg Pulm: CTA b/l Abd: soft, obese, NT/ND, +BS throughout Ext: no c/c/e Neuro: nonfocal   RELEVANT LABS AND IMAGING: CBC    Component Value Date/Time   WBC 7.4 07/24/2013 1536   RBC 4.22 07/24/2013 1536   HGB 12.4  07/24/2013 1536   HCT 37.3 07/24/2013 1536   PLT 380.0 07/24/2013 1536   MCV 88.3 07/24/2013 1536   MCHC 33.3 07/24/2013 1536   RDW 15.8* 07/24/2013 1536    CMP     Component Value Date/Time   NA 139 07/24/2013 1536   K 3.8 07/24/2013 1536   CL 106 07/24/2013 1536   CO2 29 07/24/2013 1536   GLUCOSE 96 07/24/2013 1536   BUN 10 07/24/2013 1536   CREATININE 0.9 07/24/2013 1536   CALCIUM 9.3 07/24/2013 1536   PROT 7.6 07/24/2013 1536   ALBUMIN 3.5 07/24/2013 1536   AST 20 07/24/2013 1536   ALT 15 07/24/2013 1536   ALKPHOS 58 07/24/2013 1536   BILITOT 0.4 07/24/2013 1536    ASSESSMENT/PLAN: 48 year old female with a past medical history of ulcerative colitis diagnosed in 2001 with initial disease to the hepatic flexure maintained on balsalazide who presents for surveillance colonoscopy.   1. UC, chronic -- patient presents for surveillance colonoscopy. Will plan surveillance biopsies. Clinically doing well on balsalazide.  The nature of the procedure, as well as the risks, benefits, and alternatives were carefully and thoroughly reviewed with the patient. Ample time for discussion and questions allowed. The patient understood, was satisfied, and agreed to proceed.

## 2014-02-19 NOTE — Anesthesia Preprocedure Evaluation (Signed)
Anesthesia Evaluation  Patient identified by MRN, date of birth, ID band Patient awake    Reviewed: Allergy & Precautions, H&P , NPO status , Patient's Chart, lab work & pertinent test results  Airway Mallampati: II  TM Distance: >3 FB Neck ROM: Full    Dental no notable dental hx.    Pulmonary neg pulmonary ROS,  breath sounds clear to auscultation  Pulmonary exam normal       Cardiovascular hypertension, Pt. on medications and Pt. on home beta blockers Rhythm:Regular Rate:Normal     Neuro/Psych negative neurological ROS  negative psych ROS   GI/Hepatic Neg liver ROS, PUD, GERD-  ,  Endo/Other  negative endocrine ROS  Renal/GU negative Renal ROS  negative genitourinary   Musculoskeletal negative musculoskeletal ROS (+)   Abdominal (+) + obese,   Peds negative pediatric ROS (+)  Hematology  (+) anemia ,   Anesthesia Other Findings   Reproductive/Obstetrics negative OB ROS                             Anesthesia Physical Anesthesia Plan  ASA: II  Anesthesia Plan: General   Post-op Pain Management:    Induction: Intravenous  Airway Management Planned:   Additional Equipment:   Intra-op Plan:   Post-operative Plan: Extubation in OR  Informed Consent: I have reviewed the patients History and Physical, chart, labs and discussed the procedure including the risks, benefits and alternatives for the proposed anesthesia with the patient or authorized representative who has indicated his/her understanding and acceptance.   Dental advisory given  Plan Discussed with: CRNA  Anesthesia Plan Comments:         Anesthesia Quick Evaluation

## 2014-02-19 NOTE — Op Note (Signed)
[facility] [place], [address]   COLONOSCOPY PROCEDURE REPORT  PATIENT: Vicki Burton, Vicki Burton  MR#: [id number] BIRTHDATE: 02-28-1966 , 48  yrs. old GENDER: female ENDOSCOPIST: Jerene Bears, MD PROCEDURE DATE:  02/19/2014 PROCEDURE:   Colonoscopy with biopsy First Screening Colonoscopy - Avg.  risk and is 50 yrs.  old or older - No.  Prior Negative Screening - Now for repeat screening. N/A  History of Adenoma - Now for follow-up colonoscopy & has been > or = to 3 yrs.  N/A  Polyps Removed Today? No.  Polyps Removed Today? No.  Recommend repeat exam, <10 yrs? Polyps Removed Today? No.  Recommend repeat exam, <10 yrs? Yes.  Polyps Removed Today? No.  Recommend repeat exam, <10 yrs? Yes.  High risk (family or personal hx). ASA CLASS:   Class III INDICATIONS:previously diagnosed ulcerative colitis with current disease duration > 8 years and previously diagnosed ulcerative colitis with current disease duration > 8 years. MEDICATIONS: Monitored anesthesia care and Per Anesthesia  DESCRIPTION OF PROCEDURE:   After the risks benefits and alternatives of the procedure were thoroughly explained, informed consent was obtained.  The digital rectal exam revealed no rectal mass.   The EC-[id number] ([id number])  endoscope was introduced through the anus and advanced to the terminal ileum which was intubated for a short distance. No adverse events experienced.   The quality of the prep was good, using MoviPrep  The instrument was then slowly withdrawn as the colon was fully examined.   COLON FINDINGS: The examined terminal ileum appeared to be normal. Very subtle and mild erythema with subtle loss of vascularity in the majority of the transverse colon, descending colon sigmoid and rectosigmoid.  Surveillance biopsies performed, in 4 quadrant fashion, every 10 cm throughout the colon and placed in the right colon and left colon jar. No polyps seen.  Retroflexed  views revealed no abnormalities. The time to cecum=3 minutes 00 seconds. Withdrawal time=11 minutes 00 seconds.  The scope was withdrawn and the procedure completed.  COMPLICATIONS: There were no immediate complications.     ENDOSCOPIC IMPRESSION: 1.   The examined terminal ileum appeared to be normal 2.   Subtle, probably mild colitis from transverse colon to rectum. Four-quadrant surveillance biopsies performed every 10 cm throughout the colon  RECOMMENDATIONS: 1.  Await biopsy results 2.  Continue balsalazide at current dose 3.  You will receive a letter within 1-2 weeks with the results of your biopsy as well as final recommendations.  Please call my office if you have not received a letter after 3 weeks. 4.  Office follow-up in 6 months  eSigned:  Jerene Bears, MD 02/19/2014 10:17 AM   cc: The Patient   PATIENT NAME:  [name], [name] MR#: [id number]

## 2014-02-19 NOTE — Anesthesia Postprocedure Evaluation (Signed)
  Anesthesia Post-op Note  Patient: Vicki Burton  Procedure(s) Performed: Procedure(s) (LRB): COLONOSCOPY (N/A)  Patient Location: PACU  Anesthesia Type: MAC  Level of Consciousness: awake and alert   Airway and Oxygen Therapy: Patient Spontanous Breathing  Post-op Pain: mild  Post-op Assessment: Post-op Vital signs reviewed, Patient's Cardiovascular Status Stable, Respiratory Function Stable, Patent Airway and No signs of Nausea or vomiting  Last Vitals:  Filed Vitals:   02/19/14 1040  BP: 161/71  Pulse: 51  Temp:   Resp: 17    Post-op Vital Signs: stable   Complications: No apparent anesthesia complications

## 2014-02-19 NOTE — Transfer of Care (Signed)
Immediate Anesthesia Transfer of Care Note  Patient: Vicki Burton  Procedure(s) Performed: Procedure(s) (LRB): COLONOSCOPY (N/A)  Patient Location: PACU  Anesthesia Type: MAC  Level of Consciousness: sedated, patient cooperative and responds to stimulation  Airway & Oxygen Therapy: Patient Spontanous Breathing and Patient connected to face mask oxgen  Post-op Assessment: Report given to PACU RN and Post -op Vital signs reviewed and stable  Post vital signs: Reviewed and stable  Complications: No apparent anesthesia complications

## 2014-02-22 ENCOUNTER — Encounter (HOSPITAL_COMMUNITY): Payer: Self-pay | Admitting: Internal Medicine

## 2014-03-01 ENCOUNTER — Encounter: Payer: Self-pay | Admitting: Internal Medicine

## 2014-03-23 ENCOUNTER — Other Ambulatory Visit: Payer: Self-pay | Admitting: Internal Medicine

## 2014-06-18 ENCOUNTER — Encounter: Payer: Self-pay | Admitting: Internal Medicine

## 2014-07-22 ENCOUNTER — Other Ambulatory Visit: Payer: Self-pay | Admitting: Internal Medicine

## 2014-08-23 ENCOUNTER — Other Ambulatory Visit: Payer: Self-pay | Admitting: Internal Medicine

## 2014-09-19 ENCOUNTER — Other Ambulatory Visit: Payer: Self-pay | Admitting: Internal Medicine

## 2014-10-24 ENCOUNTER — Other Ambulatory Visit: Payer: Self-pay | Admitting: Internal Medicine

## 2014-11-29 ENCOUNTER — Other Ambulatory Visit: Payer: Self-pay | Admitting: Internal Medicine

## 2014-12-01 ENCOUNTER — Other Ambulatory Visit: Payer: Self-pay | Admitting: Internal Medicine

## 2014-12-06 ENCOUNTER — Telehealth: Payer: Self-pay | Admitting: Internal Medicine

## 2014-12-06 ENCOUNTER — Other Ambulatory Visit: Payer: Self-pay | Admitting: Internal Medicine

## 2014-12-07 MED ORDER — BALSALAZIDE DISODIUM 750 MG PO CAPS
2250.0000 mg | ORAL_CAPSULE | Freq: Two times a day (BID) | ORAL | Status: DC
Start: 2014-12-07 — End: 2015-02-20

## 2014-12-07 NOTE — Telephone Encounter (Signed)
Rx sent 

## 2015-02-11 ENCOUNTER — Encounter: Payer: Self-pay | Admitting: Internal Medicine

## 2015-02-11 ENCOUNTER — Ambulatory Visit (INDEPENDENT_AMBULATORY_CARE_PROVIDER_SITE_OTHER): Payer: BC Managed Care – PPO | Admitting: Internal Medicine

## 2015-02-11 VITALS — BP 124/68 | HR 72 | Ht 60.0 in | Wt 306.0 lb

## 2015-02-11 DIAGNOSIS — H5711 Ocular pain, right eye: Secondary | ICD-10-CM | POA: Diagnosis not present

## 2015-02-11 DIAGNOSIS — K5100 Ulcerative (chronic) pancolitis without complications: Secondary | ICD-10-CM | POA: Diagnosis not present

## 2015-02-11 NOTE — Patient Instructions (Addendum)
Please continue balsalazide 3 tablets twice daily  You will be due for a recall colonoscopy in 02/2016. We will send you a reminder in the mail when it gets closer to that time. This can serve as a follow up office visit.

## 2015-02-11 NOTE — Progress Notes (Signed)
   Subjective:    Patient ID: Vicki Burton, female    DOB: 1965/08/25, 49 y.o.   MRN: [id number]  HPI Vicki Burton is a 49 year old female with ulcerative colitis, rectum to hepatic flexure diagnosed in 2001, who is here for follow-up. She was last seen at the time of surveillance colonoscopy in November 2015. This revealed subtle mild colitis changes from the rectum to hepatic flexure. The terminal ileum and ascending colon/cecum were normal. Biopsy showed quiescent colitis without dysplasia.  She reports that from a bowel standpoint she's been doing well. She is taking balsalazide 2.25 g twice a day. She denies abdominal pain. Regular bowel habits. No diarrhea. No rectal bleeding or melena. Good appetite. No upper GI complaints or hepatobiliary complaint. She has had issues with her right eye as well as maxillary sinus on the right. She's been seen by ophthalmology, ENT and her dentist. Initially they felt she had iritis or episcleritis but she failed respond to steroids. She has been referred to see [facility] and also an ear nose and throat specialist at [facility].   Review of Systems    as per history of present illness, otherwise negative  Current Medications, Allergies, Past Medical History, Past Surgical History, Family History and Social History were reviewed in [employer] record.  Objective:   Physical Exam BP 124/68 mmHg  Pulse 72  Ht 5' (1.524 m)  Wt 306 lb (138.801 kg)  BMI 59.76 kg/m2  LMP 12/16/2014 (Approximate) Constitutional: Well-developed and well-nourished. No distress. HEENT: Normocephalic and atraumatic. Oropharynx is clear and moist. No oropharyngeal exudate. Conjunctival injection and erythema right eye.  No scleral icterus. Neck: Neck supple. Trachea midline. Cardiovascular: Normal rate, regular rhythm and intact distal pulses. No M/R/G Pulmonary/chest: Effort normal and breath sounds normal. No wheezing, rales or rhonchi. Abdominal:  Soft, obese, nontender, nondistended. Bowel sounds active throughout. Extremities: no clubbing, cyanosis, or edema Lymphadenopathy: No cervical adenopathy noted. Neurological: Alert and oriented to person place and time. Skin: Skin is warm and dry. No rashes noted. Psychiatric: Normal mood and affect. Behavior is normal.     Assessment & Plan:   49 year old female with ulcerative colitis, rectum to hepatic flexure diagnosed in 2001, who is here for follow-up.  1. UC, dx 2001 -- clinically her disease remains in remission. There was no evidence of active disease at time of surveillance colonoscopy last year. We'll continue balsalazide 2.25 g twice daily. Repeat surveillance colonoscopy recommended next November, 2017. Call if any signs or symptoms of active flare.  2. Eye pain -- she has been referred to ophthalmology and ENT for further evaluation.  Return in one year, sooner if necessary

## 2015-02-20 ENCOUNTER — Other Ambulatory Visit: Payer: Self-pay | Admitting: Internal Medicine

## 2015-07-16 HISTORY — PX: OTHER SURGICAL HISTORY: SHX169

## 2015-09-23 ENCOUNTER — Other Ambulatory Visit: Payer: Self-pay | Admitting: Internal Medicine

## 2015-11-19 ENCOUNTER — Other Ambulatory Visit: Payer: Self-pay | Admitting: Internal Medicine

## 2015-11-22 ENCOUNTER — Telehealth: Payer: Self-pay | Admitting: *Deleted

## 2015-11-22 NOTE — Telephone Encounter (Signed)
[facility] (phone [phone]) has approved patient's Balsalazide from 11/22/15-11/21/16. Approval number is [id number].

## 2015-11-29 ENCOUNTER — Encounter: Payer: Self-pay | Admitting: *Deleted

## 2015-12-13 ENCOUNTER — Encounter: Payer: Self-pay | Admitting: Internal Medicine

## 2015-12-16 ENCOUNTER — Telehealth: Payer: Self-pay | Admitting: *Deleted

## 2015-12-16 DIAGNOSIS — K5100 Ulcerative (chronic) pancolitis without complications: Secondary | ICD-10-CM

## 2015-12-16 NOTE — Telephone Encounter (Signed)
Patient states that last colon was done at the hospital due to her weight. Patient states that she weighs 305-310 pounds. Will this procedure need to be scheduled at the hospital? [facility] # [phone]

## 2015-12-16 NOTE — Telephone Encounter (Signed)
Her BMI is >50. She will need to be scheduled for any procedure at the hospital. I have explained this to the patient.

## 2015-12-16 NOTE — Telephone Encounter (Signed)
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 Dr Hilarie Fredrickson has asked that patient schedule a colonoscopy to assess IBD disease prior to making decision regarding biologics. I have spoken to patient to advise her of this information and she states that she will need to look at her calender and call us back to schedule at a later time.

## 2015-12-20 ENCOUNTER — Other Ambulatory Visit: Payer: Self-pay | Admitting: Internal Medicine

## 2015-12-20 DIAGNOSIS — K5100 Ulcerative (chronic) pancolitis without complications: Secondary | ICD-10-CM

## 2015-12-21 MED ORDER — NA SULFATE-K SULFATE-MG SULF 17.5-3.13-1.6 GM/177ML PO SOLN: ORAL | 0 refills | Status: DC

## 2015-12-21 NOTE — Telephone Encounter (Signed)
Patient has been scheduled for colonoscopy at [place] on 01/12/16 @ 9:15 am. Patient has been advised and verbalizes understanding. She will come by the office tomorrow for instructions for time/date/location/prep of procedure.

## 2015-12-22 NOTE — Telephone Encounter (Signed)
Patient came to the office this morning for prep instructions. Instructions given to patient and I have gone over them verbally with her. She verbalizes understanding.

## 2016-01-06 ENCOUNTER — Encounter (HOSPITAL_COMMUNITY): Payer: Self-pay | Admitting: *Deleted

## 2016-01-06 ENCOUNTER — Telehealth: Payer: Self-pay | Admitting: Internal Medicine

## 2016-01-06 NOTE — Telephone Encounter (Signed)
Dr Pyrtle-Did you still need me to move patient to a later time on 01/12/16?

## 2016-01-09 NOTE — Progress Notes (Signed)
Moved Patient to 1015 instead of 0930 on 01/12/16 on the schedule,  notified pt to come in at [place].

## 2016-01-09 NOTE — Telephone Encounter (Signed)
Patient is now scheduled for colon with MAC at 10:15 am She should arrive at 8:45 am

## 2016-01-09 NOTE — Telephone Encounter (Signed)
Patient advised.

## 2016-01-11 NOTE — Anesthesia Preprocedure Evaluation (Addendum)
Anesthesia Evaluation  Patient identified by MRN, date of birth, ID band Patient awake    Reviewed: Allergy & Precautions, NPO status , Patient's Chart, lab work & pertinent test results, reviewed documented beta blocker date and time   Airway Mallampati: I  TM Distance: >3 FB Neck ROM: Full    Dental  (+) Teeth Intact, Dental Advisory Given   Pulmonary neg pulmonary ROS,    breath sounds clear to auscultation       Cardiovascular hypertension, Pt. on home beta blockers and Pt. on medications  Rhythm:Regular Rate:Normal     Neuro/Psych negative neurological ROS  negative psych ROS   GI/Hepatic Neg liver ROS, PUD, GERD  ,  Endo/Other  negative endocrine ROS  Renal/GU negative Renal ROS  negative genitourinary   Musculoskeletal negative musculoskeletal ROS (+)   Abdominal (+) + obese,   Peds negative pediatric ROS (+)  Hematology negative hematology ROS (+)   Anesthesia Other Findings   Reproductive/Obstetrics negative OB ROS                            Anesthesia Physical Anesthesia Plan  ASA: III  Anesthesia Plan: MAC   Post-op Pain Management:    Induction: Intravenous  Airway Management Planned: Natural Airway  Additional Equipment:   Intra-op Plan:   Post-operative Plan:   Informed Consent: I have reviewed the patients History and Physical, chart, labs and discussed the procedure including the risks, benefits and alternatives for the proposed anesthesia with the patient or authorized representative who has indicated his/her understanding and acceptance.   Dental advisory given  Plan Discussed with: CRNA  Anesthesia Plan Comments:         Anesthesia Quick Evaluation

## 2016-01-12 ENCOUNTER — Ambulatory Visit (HOSPITAL_COMMUNITY)
Admission: RE | Admit: 2016-01-12 | Discharge: 2016-01-12 | Disposition: A | Discharge status: Home / Self Care | Payer: BC Managed Care – PPO | Source: Ambulatory Visit | Attending: Internal Medicine | Admitting: Internal Medicine

## 2016-01-12 ENCOUNTER — Other Ambulatory Visit: Payer: Self-pay | Admitting: Internal Medicine

## 2016-01-12 ENCOUNTER — Ambulatory Visit (HOSPITAL_COMMUNITY): Payer: BC Managed Care – PPO | Admitting: Anesthesiology

## 2016-01-12 ENCOUNTER — Encounter (HOSPITAL_COMMUNITY)
Admission: RE | Disposition: A | Discharge status: Home / Self Care | Payer: Self-pay | Source: Ambulatory Visit | Attending: Internal Medicine

## 2016-01-12 ENCOUNTER — Encounter (HOSPITAL_COMMUNITY): Payer: Self-pay | Admitting: Anesthesiology

## 2016-01-12 ENCOUNTER — Other Ambulatory Visit (INDEPENDENT_AMBULATORY_CARE_PROVIDER_SITE_OTHER): Payer: BC Managed Care – PPO

## 2016-01-12 DIAGNOSIS — E538 Deficiency of other specified B group vitamins: Secondary | ICD-10-CM | POA: Diagnosis not present

## 2016-01-12 DIAGNOSIS — Z79899 Other long term (current) drug therapy: Secondary | ICD-10-CM | POA: Diagnosis not present

## 2016-01-12 DIAGNOSIS — Z1211 Encounter for screening for malignant neoplasm of colon: Secondary | ICD-10-CM | POA: Diagnosis present

## 2016-01-12 DIAGNOSIS — K529 Noninfective gastroenteritis and colitis, unspecified: Secondary | ICD-10-CM | POA: Diagnosis not present

## 2016-01-12 DIAGNOSIS — Z6841 Body Mass Index (BMI) 40.0 and over, adult: Secondary | ICD-10-CM | POA: Diagnosis not present

## 2016-01-12 DIAGNOSIS — H209 Unspecified iridocyclitis: Secondary | ICD-10-CM | POA: Diagnosis not present

## 2016-01-12 DIAGNOSIS — K5100 Ulcerative (chronic) pancolitis without complications: Secondary | ICD-10-CM

## 2016-01-12 DIAGNOSIS — Z793 Long term (current) use of hormonal contraceptives: Secondary | ICD-10-CM | POA: Insufficient documentation

## 2016-01-12 DIAGNOSIS — E669 Obesity, unspecified: Secondary | ICD-10-CM | POA: Diagnosis not present

## 2016-01-12 DIAGNOSIS — Z800 Family history of malignant neoplasm of digestive organs: Secondary | ICD-10-CM | POA: Diagnosis not present

## 2016-01-12 DIAGNOSIS — I10 Essential (primary) hypertension: Secondary | ICD-10-CM | POA: Insufficient documentation

## 2016-01-12 HISTORY — PX: COLONOSCOPY WITH PROPOFOL: SHX5780

## 2016-01-12 LAB — CBC WITH DIFFERENTIAL/PLATELET
Basophils Absolute: 0 10*3/uL (ref 0.0–0.1)
Basophils Relative: 0.2 % (ref 0.0–3.0)
EOS PCT: 2.6 % (ref 0.0–5.0)
Eosinophils Absolute: 0.1 10*3/uL (ref 0.0–0.7)
HCT: 36.5 % (ref 36.0–46.0)
HEMOGLOBIN: 12.3 g/dL (ref 12.0–15.0)
LYMPHS ABS: 1.5 10*3/uL (ref 0.7–4.0)
Lymphocytes Relative: 34.3 % (ref 12.0–46.0)
MCHC: 33.6 g/dL (ref 30.0–36.0)
MCV: 84.7 fl (ref 78.0–100.0)
MONOS PCT: 6.2 % (ref 3.0–12.0)
Monocytes Absolute: 0.3 10*3/uL (ref 0.1–1.0)
Neutro Abs: 2.6 10*3/uL (ref 1.4–7.7)
Neutrophils Relative %: 56.7 % (ref 43.0–77.0)
Platelets: 397 10*3/uL (ref 150.0–400.0)
RBC: 4.31 Mil/uL (ref 3.87–5.11)
RDW: 16.8 % — AB (ref 11.5–15.5)
WBC: 4.5 10*3/uL (ref 4.0–10.5)

## 2016-01-12 LAB — COMPREHENSIVE METABOLIC PANEL
ALK PHOS: 70 U/L (ref 39–117)
ALT: 16 U/L (ref 0–35)
AST: 25 U/L (ref 0–37)
Albumin: 4 g/dL (ref 3.5–5.2)
BILIRUBIN TOTAL: 0.5 mg/dL (ref 0.2–1.2)
BUN: 10 mg/dL (ref 6–23)
CALCIUM: 9.4 mg/dL (ref 8.4–10.5)
CO2: 30 mEq/L (ref 19–32)
Chloride: 99 mEq/L (ref 96–112)
Creatinine, Ser: 0.99 mg/dL (ref 0.40–1.20)
GFR: 75.61 mL/min (ref >60.00)
Glucose, Bld: 93 mg/dL (ref 70–99)
POTASSIUM: 3.6 meq/L (ref 3.5–5.1)
Sodium: 138 mEq/L (ref 135–145)
TOTAL PROTEIN: 8.3 g/dL (ref 6.0–8.3)

## 2016-01-12 LAB — HIGH SENSITIVITY CRP: CRP, High Sensitivity: 26.22 mg/L — ABNORMAL HIGH (ref 0.000–5.000)

## 2016-01-12 SURGERY — COLONOSCOPY WITH PROPOFOL
Anesthesia: Monitor Anesthesia Care

## 2016-01-12 MED ORDER — SODIUM CHLORIDE 0.9 % IV SOLN: INTRAVENOUS | Status: DC

## 2016-01-12 MED ORDER — PROPOFOL 10 MG/ML IV BOLUS
INTRAVENOUS | Status: AC
  Filled 2016-01-12: qty 60

## 2016-01-12 MED ORDER — GLYCOPYRROLATE 0.2 MG/ML IV SOSY
PREFILLED_SYRINGE | INTRAVENOUS | Status: AC
  Filled 2016-01-12: qty 3

## 2016-01-12 MED ORDER — LACTATED RINGERS IV SOLN
INTRAVENOUS | Status: DC
  Administered 2016-01-12: 09:00:00 via INTRAVENOUS

## 2016-01-12 MED ORDER — MIDAZOLAM HCL 2 MG/2ML IJ SOLN
INTRAMUSCULAR | Status: AC
  Filled 2016-01-12: qty 2

## 2016-01-12 MED ORDER — PROPOFOL 500 MG/50ML IV EMUL
INTRAVENOUS | Status: DC | PRN
  Administered 2016-01-12: 100 ug/kg/min via INTRAVENOUS

## 2016-01-12 MED ORDER — MIDAZOLAM HCL 5 MG/5ML IJ SOLN
INTRAMUSCULAR | Status: DC | PRN
  Administered 2016-01-12: 2 mg via INTRAVENOUS

## 2016-01-12 MED ORDER — PROPOFOL 500 MG/50ML IV EMUL
INTRAVENOUS | Status: DC | PRN
  Administered 2016-01-12 (×2): 40 mg via INTRAVENOUS

## 2016-01-12 MED ORDER — GLYCOPYRROLATE 0.2 MG/ML IJ SOLN
INTRAMUSCULAR | Status: DC | PRN
  Administered 2016-01-12: 0.2 mg via INTRAVENOUS

## 2016-01-12 SURGICAL SUPPLY — 21 items

## 2016-01-12 NOTE — Transfer of Care (Signed)
Immediate Anesthesia Transfer of Care Note  Patient: Vicki Burton  Procedure(s) Performed: Procedure(s): COLONOSCOPY WITH PROPOFOL (N/A)  Patient Location: PACU  Anesthesia Type:MAC  Level of Consciousness:  sedated, patient cooperative and responds to stimulation  Airway & Oxygen Therapy:Patient Spontanous Breathing and Patient connected to face mask oxgen  Post-op Assessment:  Report given to PACU RN and Post -op Vital signs reviewed and stable  Post vital signs:  Reviewed and stable  Last Vitals:  Vitals:   01/12/16 0859 01/12/16 1045  BP: (!) 182/70 (!) 97/48  Pulse: 71 67  Resp: 14 19  Temp: 88.2 C     Complications: No apparent anesthesia complications

## 2016-01-12 NOTE — Discharge Instructions (Signed)
YOU HAD AN ENDOSCOPIC PROCEDURE TODAY: Refer to the procedure report and other information in the discharge instructions given to you for any specific questions about what was found during the examination. If this information does not answer your questions, please call [place] office at [phone] to clarify.  ° °YOU SHOULD EXPECT: Some feelings of bloating in the abdomen. Passage of more gas than usual. Walking can help get rid of the air that was put into your GI tract during the procedure and reduce the bloating. If you had a lower endoscopy (such as a colonoscopy or flexible sigmoidoscopy) you may notice spotting of blood in your stool or on the toilet paper. Some abdominal soreness may be present for a day or two, also. ° °DIET: Your first meal following the procedure should be a light meal and then it is ok to progress to your normal diet. A half-sandwich or bowl of soup is an example of a good first meal. Heavy or fried foods are harder to digest and may make you feel nauseous or bloated. Drink plenty of fluids but you should avoid alcoholic beverages for 24 hours. If you had a esophageal dilation, please see attached instructions for diet.   ° °ACTIVITY: Your care partner should take you home directly after the procedure. You should plan to take it easy, moving slowly for the rest of the day. You can resume normal activity the day after the procedure however YOU SHOULD NOT DRIVE, use power tools, machinery or perform tasks that involve climbing or major physical exertion for 24 hours (because of the sedation medicines used during the test).  ° °SYMPTOMS TO REPORT IMMEDIATELY: °A gastroenterologist can be reached at any hour. Please call [phone]  for any of the following symptoms:  °Following lower endoscopy (colonoscopy, flexible sigmoidoscopy) °Excessive amounts of blood in the stool  °Significant tenderness, worsening of abdominal pains  °Swelling of the abdomen that is new, acute  °Fever of 100° or  higher  °Following upper endoscopy (EGD, EUS, ERCP, esophageal dilation) °Vomiting of blood or coffee ground material  °New, significant abdominal pain  °New, significant chest pain or pain under the shoulder blades  °Painful or persistently difficult swallowing  °New shortness of breath  °Black, tarry-looking or red, bloody stools ° °FOLLOW UP:  °If any biopsies were taken you will be contacted by phone or by letter within the next 1-3 weeks. Call [phone]  if you have not heard about the biopsies in 3 weeks.  °Please also call with any specific questions about appointments or follow up tests. ° °

## 2016-01-12 NOTE — H&P (Signed)
HPI:  Vicki Burton is a 50 year old female with ulcerative colitis rectum to hepatic flexure diagnosed in 012 presents for surveillance colonoscopy. Her last procedure was in November 2015 which revealed subtle mild colitis from the rectum to hepatic flexure the TI and ascending colon/cecum were normal. 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 She's required steroids and her ophthalmologist Dr. Clayborne Artist at [facility] has recommended escalation of therapy to control her eye disease and get her off steroids.  Past Medical History:  Diagnosis Date  . Family history of malignant neoplasm of gastrointestinal tract    maternal great aunt  . Iron deficiency anemia, unspecified   . Obesity, unspecified   . Other and unspecified noninfectious gastroenteritis and colitis(558.9)   . Ovarian cyst   . TMJ (dislocation of temporomandibular joint)   . Ulcerative colitis, unspecified   . Unspecified essential hypertension   . Unspecified iridocyclitis   . Vitamin B12 deficiency     Past Surgical History:  Procedure Laterality Date  . ACHILLES TENDON REPAIR Left   . BARTHOLIN GLAND CYST EXCISION    . COLONOSCOPY    . COLONOSCOPY N/A 02/19/2014   Procedure: COLONOSCOPY;  Surgeon: Jerene Bears, MD;  Location: [facility];  Service: Gastroenterology;  Laterality: N/A;  . cyst removed from back  07/2015     (Not in an outpatient encounter)  Allergies  Allergen Reactions  . Amlodipine Swelling    Lips/face  . Lisinopril   . Sulfonamide Derivatives     Family History  Problem Relation Age of Onset  . Diabetes Father   . Heart disease Father   . Colon cancer Other     maternal great Aunt  . Dementia Mother   . Rectal cancer Neg Hx   . Stomach cancer Neg Hx     Social History  Substance Use Topics  . Smoking status: Never Smoker  . Smokeless tobacco:  Never Used  . Alcohol use No    ROS: As per history of present illness, otherwise negative  BP (!) 182/70   Pulse 71   Temp 98 F (36.7 C) (Oral)   Resp 14   Ht 5' (1.524 m)   Wt (!) 306 lb (138.8 kg)   LMP 06/29/2015   SpO2 100%   BMI 59.76 kg/m  Gen: awake, alert, NAD HEENT: anicteric, op clear CV: RRR, no mrg Pulm: CTA b/l Abd: soft, obese, NT/ND, +BS throughout Ext: no c/c/e Neuro: nonfocal   RELEVANT LABS AND IMAGING: CBC    Component Value Date/Time   WBC 7.4 07/24/2013 1536   RBC 4.22 07/24/2013 1536   HGB 12.4 07/24/2013 1536   HCT 37.3 07/24/2013 1536   PLT 380.0 07/24/2013 1536   MCV 88.3 07/24/2013 1536   MCHC 33.3 07/24/2013 1536   RDW 15.8 (H) 07/24/2013 1536    CMP     Component Value Date/Time   NA 139 07/24/2013 1536   K 3.8 07/24/2013 1536   CL 106 07/24/2013 1536   CO2 29 07/24/2013 1536   GLUCOSE 96 07/24/2013 1536   BUN 10 07/24/2013 1536   CREATININE 0.9 07/24/2013 1536   CALCIUM 9.3 07/24/2013 1536   PROT 7.6 07/24/2013 1536   ALBUMIN 3.5 07/24/2013 1536   AST 20 07/24/2013 1536   ALT 15 07/24/2013 1536   ALKPHOS 58 07/24/2013 1536   BILITOT 0.4 07/24/2013 1536  ASSESSMENT/PLAN: 50 year old with ulcerative colitis and uveitis  1. Surveillance colonoscopy today for long-standing ulcerative colitis -- colonoscopy with monitored anesthesia care.  The nature of the procedure, as well as the risks, benefits, and alternatives were carefully and thoroughly reviewed with the patient. Ample time for discussion and questions allowed. The patient understood, was satisfied, and agreed to proceed.

## 2016-01-12 NOTE — Anesthesia Postprocedure Evaluation (Signed)
Anesthesia Post Note  Patient: Vicki Burton  Procedure(s) Performed: Procedure(s) (LRB): COLONOSCOPY WITH PROPOFOL (N/A)  Patient location during evaluation: PACU Anesthesia Type: MAC Level of consciousness: awake and alert Pain management: pain level controlled Vital Signs Assessment: post-procedure vital signs reviewed and stable Respiratory status: spontaneous breathing, nonlabored ventilation, respiratory function stable and patient connected to nasal cannula oxygen Cardiovascular status: stable and blood pressure returned to baseline Anesthetic complications: no    Last Vitals:  Vitals:   01/12/16 1050 01/12/16 1100  BP: (!) 111/58 (!) 113/49  Pulse: 64 66  Resp: 15 18  Temp:      Last Pain:  Vitals:   01/12/16 0859  TempSrc: Oral                 Effie Berkshire

## 2016-01-12 NOTE — Op Note (Signed)
[facility] Patient Name: Vicki Burton Procedure Date: 01/12/2016 MRN: [id number] Attending MD: Jerene Bears , MD Date of Birth: 09-15-1965 CSN: [id number] Age: 50 Admit Type: Outpatient Procedure:                Colonoscopy Indications:              High risk colon cancer surveillance: Ulcerative                            pancolitis of 8 (or more) years duration, Last                            colonoscopy: November 2015 Providers:                Lajuan Lines. Jazman Reuter, MDShelby Terence Lux RN, RN, Alfonso Patten, Technician, Arnoldo Hooker, CRNA Referring MD:              Medicines:                Monitored Anesthesia Care Complications:            No immediate complications. Estimated Blood Loss:     Estimated blood loss was minimal. Procedure:                Pre-Anesthesia Assessment:                           - Prior to the procedure, a History and Physical                            was performed, and patient medications and                            allergies were reviewed. The patient's tolerance of                            previous anesthesia was also reviewed. The risks                            and benefits of the procedure and the sedation                            options and risks were discussed with the patient.                            All questions were answered, and informed consent                            was obtained. Prior Anticoagulants: The patient has                            taken no previous anticoagulant or antiplatelet  agents. ASA Grade Assessment: III - A patient with                            severe systemic disease. After reviewing the risks                            and benefits, the patient was deemed in                            satisfactory condition to undergo the procedure.                           After obtaining informed consent, the colonoscope                            was  passed under direct vision. Throughout the                            procedure, the patient's blood pressure, pulse, and                            oxygen saturations were monitored continuously. The                            EC-[id number] ([id number]) scope was introduced through                            the anus and advanced to the the terminal ileum.                            The colonoscopy was performed without difficulty.                            The patient tolerated the procedure well. The                            quality of the bowel preparation was good. The                            terminal ileum, ileocecal valve, appendiceal                            orifice, and rectum were photographed. Scope In: 10:24:22 AM Scope Out: 10:38:39 AM Scope Withdrawal Time: 0 hours 9 minutes 58 seconds  Total Procedure Duration: 0 hours 14 minutes 17 seconds  Findings:      The digital rectal exam was normal.      The terminal ileum appeared normal.      Inflammation characterized by altered vascularity, congestion (edema),       erosions, erythema and granularity was found in a continuous and       circumferential pattern from the mid sigmoid colon to the proximal       descending colon. The rectum, the recto-sigmoid colon, the transverse       colon, the ascending colon and the  cecum were spared. This was mild to       moderate in severity, and when compared to previous examinations, the       findings are worsened. Four biopsies were taken every 10 cm with a cold       forceps from the entire colon for ulcerative colitis surveillance. These       biopsy specimens from the right colon, left colon and rectum were sent       to Pathology.      The retroflexed view of the distal rectum and anal verge was normal and       showed no anal or rectal abnormalities. Impression:               - The examined portion of the ileum was normal.                           - Inflammation was found from  the sigmoid colon to                            the descending colon. This was mild to moderate in                            severity. The findings are worsened compared to                            previous examinations. Biopsied.                           - The distal rectum and anal verge are normal on                            retroflexion view. Moderate Sedation:      N/A Recommendation:           - Patient has a contact number available for                            emergencies. The signs and symptoms of potential                            delayed complications were discussed with the                            patient. Return to normal activities tomorrow.                            Written discharge instructions were provided to the                            patient.                           - Resume previous diet.                           - Continue present medications.                           -  Await pathology results.                           - Repeat colonoscopy in 2 years for surveillance.                           - Return to GI clinic at the next available                            appointment.                           - Labs to be drawn at the office (CBC, CMP, CRP,                            Hep B and C serologies, quantiferon gold).                            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 Procedure Code(s):        --- Professional ---                           [phone], Colonoscopy, flexible; with biopsy, single                            or multiple Diagnosis Code(s):        --- Professional ---                           K51.00, Ulcerative (chronic) pancolitis without                            complications                           K52.9, Noninfective gastroenteritis and colitis,                            unspecified CPT copyright 2016 American Medical  Association. All rights reserved. The codes documented in this report are preliminary and upon coder review may  be revised to meet current compliance requirements. Jerene Bears, MD 01/12/2016 10:59:58 AM This report has been signed electronically. Number of Addenda: 0

## 2016-01-13 ENCOUNTER — Encounter (HOSPITAL_COMMUNITY): Payer: Self-pay | Admitting: Internal Medicine

## 2016-01-13 LAB — HEPATITIS B SURFACE ANTIGEN: HEP B S AG: NEGATIVE

## 2016-01-13 LAB — HEPATITIS A ANTIBODY, TOTAL: HEP A TOTAL AB: NONREACTIVE

## 2016-01-13 LAB — HEPATITIS B CORE ANTIBODY, TOTAL: HEP B C TOTAL AB: NONREACTIVE

## 2016-01-13 LAB — HEPATITIS B SURFACE ANTIBODY,QUALITATIVE: HEP B S AB: NEGATIVE

## 2016-01-13 LAB — HEPATITIS C ANTIBODY: HCV Ab: NEGATIVE

## 2016-01-15 LAB — QUANTIFERON TB GOLD ASSAY (BLOOD)
Interferon Gamma Release Assay: NEGATIVE
Mitogen-Nil: >10 IU/mL
QUANTIFERON NIL VALUE: 0.02 [IU]/mL
Quantiferon Tb Ag Minus Nil Value: 0.01 IU/mL

## 2016-01-17 ENCOUNTER — Telehealth: Payer: Self-pay | Admitting: Internal Medicine

## 2016-01-17 NOTE — Telephone Encounter (Signed)
Discussed with pt that it is fine for her to have Hep B vaccine as she did not test for the antibodies. Orders for vaccine faxed to pts PCP Dr. Redmond Pulling with [facility] at [phone].

## 2016-01-17 NOTE — Telephone Encounter (Signed)
Records faxed.

## 2016-01-20 ENCOUNTER — Telehealth: Payer: Self-pay | Admitting: Internal Medicine

## 2016-01-20 NOTE — Telephone Encounter (Signed)
Pt wanted to know why her humira script was not at her pharmacy. Discussed with pt that the script was [facility] and they are working on the prior auth and the script will come from mail order specialty pharmacy not her local [facility]. Pt also wanted to make sure her cell number was on the nurse ambassador form, call placed and number changed with Humira for nurse ambassador to contact pt.

## 2016-01-22 ENCOUNTER — Other Ambulatory Visit: Payer: Self-pay | Admitting: Internal Medicine

## 2016-01-23 ENCOUNTER — Telehealth: Payer: Self-pay | Admitting: Internal Medicine

## 2016-01-24 NOTE — Telephone Encounter (Signed)
Per [facility] they are sending the script to [place].

## 2016-03-02 ENCOUNTER — Telehealth: Payer: Self-pay | Admitting: Internal Medicine

## 2016-03-02 NOTE — Telephone Encounter (Signed)
Patient is experiencing constipation and is wondering if it is coming from Humira.  She is notified not a typical side effect, but that she can take Miralax 1-2 times a day for it.  She will call back if she is still having constipation with Miralax

## 2016-04-06 ENCOUNTER — Encounter: Payer: Self-pay | Admitting: Internal Medicine

## 2016-04-06 ENCOUNTER — Ambulatory Visit (INDEPENDENT_AMBULATORY_CARE_PROVIDER_SITE_OTHER): Payer: BC Managed Care – PPO | Admitting: Internal Medicine

## 2016-04-06 ENCOUNTER — Other Ambulatory Visit (INDEPENDENT_AMBULATORY_CARE_PROVIDER_SITE_OTHER): Payer: BC Managed Care – PPO

## 2016-04-06 VITALS — BP 128/84 | HR 58 | Ht 60.0 in | Wt 299.4 lb

## 2016-04-06 DIAGNOSIS — K5900 Constipation, unspecified: Secondary | ICD-10-CM

## 2016-04-06 DIAGNOSIS — K5100 Ulcerative (chronic) pancolitis without complications: Secondary | ICD-10-CM

## 2016-04-06 DIAGNOSIS — Z79899 Other long term (current) drug therapy: Secondary | ICD-10-CM | POA: Diagnosis not present

## 2016-04-06 LAB — CBC WITH DIFFERENTIAL/PLATELET
BASOS ABS: 0 10*3/uL (ref 0.0–0.1)
Basophils Relative: 0.9 % (ref 0.0–3.0)
EOS ABS: 0.2 10*3/uL (ref 0.0–0.7)
Eosinophils Relative: 3.8 % (ref 0.0–5.0)
HCT: 37.5 % (ref 36.0–46.0)
Hemoglobin: 12.8 g/dL (ref 12.0–15.0)
LYMPHS ABS: 2.8 10*3/uL (ref 0.7–4.0)
Lymphocytes Relative: 47.5 % — ABNORMAL HIGH (ref 12.0–46.0)
MCHC: 34.1 g/dL (ref 30.0–36.0)
MCV: 85.4 fl (ref 78.0–100.0)
MONO ABS: 0.5 10*3/uL (ref 0.1–1.0)
Monocytes Relative: 8.9 % (ref 3.0–12.0)
NEUTROS ABS: 2.2 10*3/uL (ref 1.4–7.7)
NEUTROS PCT: 38.9 % — AB (ref 43.0–77.0)
PLATELETS: 321 10*3/uL (ref 150.0–400.0)
RBC: 4.39 Mil/uL (ref 3.87–5.11)
RDW: 17.8 % — ABNORMAL HIGH (ref 11.5–15.5)
WBC: 5.8 10*3/uL (ref 4.0–10.5)

## 2016-04-06 LAB — COMPREHENSIVE METABOLIC PANEL
ALT: 24 U/L (ref 0–35)
AST: 21 U/L (ref 0–37)
Albumin: 4.3 g/dL (ref 3.5–5.2)
Alkaline Phosphatase: 54 U/L (ref 39–117)
BILIRUBIN TOTAL: 0.4 mg/dL (ref 0.2–1.2)
BUN: 20 mg/dL (ref 6–23)
CO2: 30 meq/L (ref 19–32)
Calcium: 9.5 mg/dL (ref 8.4–10.5)
Chloride: 105 mEq/L (ref 96–112)
Creatinine, Ser: 0.87 mg/dL (ref 0.40–1.20)
GFR: 87.69 mL/min (ref >60.00)
GLUCOSE: 96 mg/dL (ref 70–99)
Potassium: 3.8 mEq/L (ref 3.5–5.1)
SODIUM: 138 meq/L (ref 135–145)
Total Protein: 8.1 g/dL (ref 6.0–8.3)

## 2016-04-06 LAB — HIGH SENSITIVITY CRP: CRP, High Sensitivity: 3.18 mg/L (ref 0.000–5.000)

## 2016-04-06 MED ORDER — ONDANSETRON HCL 8 MG PO TABS: 8.0000 mg | ORAL_TABLET | Freq: Three times a day (TID) | ORAL | 2 refills | Status: DC | PRN

## 2016-04-06 NOTE — Patient Instructions (Signed)
Your physician has requested that you go to the basement for the following lab work before leaving today: CBC, CMP, CRP  Continue Miralax daily.  Continue Humira.  Please follow up with Dr Hilarie Fredrickson in 6 months.  We have sent the following medications to your pharmacy for you to pick up at your convenience: Zofran 4 mg every 8 hours as needed  If you are age 52 or older, your body mass index should be between 23-30. Your Body mass index is 58.47 kg/m. If this is out of the aforementioned range listed, please consider follow up with your Primary Care Provider.  If you are age 52 or younger, your body mass index should be between 19-25. Your Body mass index is 58.47 kg/m. If this is out of the aformentioned range listed, please consider follow up with your Primary Care Provider.

## 2016-04-06 NOTE — Progress Notes (Signed)
Subjective:    Patient ID: Vicki Burton, female    DOB: 18-Feb-1966, 50 y.o.   MRN: [id number]  HPI Vicki Burton is a 50 year old female with ulcerative colitis (hepatic flexure to rectum though most recently left-sided), uveitis, obesity, B12 deficiency he is here for follow-up. This is her first visit since starting Humira. She did well with induction and has taken 2 standard doses of 40 mg every 14 days.  She is tolerating this medication well. She stopped balsalazide when she started Humira because she felt like this was our recommendation. She has noted constipation but no further bleeding or blood in her stool. Stools are no longer loose. No abdominal pain. She is followed closely with her ophthalmologist and is now off steroids for the first time in many months. Her ophthalmologist Dr. Clayborne Artist feels like Humira is responsible for her improving uveitis. She has noticed some mild nausea along with her constipation. She is reporting dry mouth. No heartburn despite being off omeprazole for 2 weeks. Her appetite has been good but she has noticed a 10 pound weight loss which is unintentional  Her colonoscopy which was performed prior to starting Humira on 01/12/2016 showed mild to moderate inflammation in the sigmoid and descending colon which was worsened compared to prior examination. The ileum was normal. Biopsies were consistent with chronic active colitis.  Review of Systems As per history of present illness, otherwise negative  Current Medications, Allergies, Past Medical History, Past Surgical History, Family History and Social History were reviewed in [employer] record.     Objective:   Physical Exam BP 128/84   Pulse (!) 58   Ht 5' (1.524 m)   Wt 299 lb 6 oz (135.8 kg)   BMI 58.47 kg/m  Constitutional: Well-developed and well-nourished. No distress. HEENT: Normocephalic and atraumatic. Oropharynx is clear and moist. No oropharyngeal exudate. Conjunctivae are  normal.  No scleral icterus. Neck: Neck supple. Trachea midline. Cardiovascular: Normal rate, regular rhythm and intact distal pulses. No M/R/G Pulmonary/chest: Effort normal and breath sounds normal. No wheezing, rales or rhonchi. Abdominal: Soft, obese,  nontender, nondistended. Bowel sounds active throughout.  Extremities: no clubbing, cyanosis, or edema Neurological: Alert and oriented to person place and time. Skin: Skin is warm and dry. No rashes noted. Psychiatric: Normal mood and affect. Behavior is normal.  CBC    Component Value Date/Time   WBC 5.8 04/06/2016 1205   RBC 4.39 04/06/2016 1205   HGB 12.8 04/06/2016 1205   HCT 37.5 04/06/2016 1205   PLT 321.0 04/06/2016 1205   MCV 85.4 04/06/2016 1205   MCHC 34.1 04/06/2016 1205   RDW 17.8 (H) 04/06/2016 1205   LYMPHSABS 2.8 04/06/2016 1205   MONOABS 0.5 04/06/2016 1205   EOSABS 0.2 04/06/2016 1205   BASOSABS 0.0 04/06/2016 1205   CMP     Component Value Date/Time   NA 138 04/06/2016 1205   K 3.8 04/06/2016 1205   CL 105 04/06/2016 1205   CO2 30 04/06/2016 1205   GLUCOSE 96 04/06/2016 1205   BUN 20 04/06/2016 1205   CREATININE 0.87 04/06/2016 1205   CALCIUM 9.5 04/06/2016 1205   PROT 8.1 04/06/2016 1205   ALBUMIN 4.3 04/06/2016 1205   AST 21 04/06/2016 1205   ALT 24 04/06/2016 1205   ALKPHOS 54 04/06/2016 1205   BILITOT 0.4 04/06/2016 1205      Assessment & Plan:  50 year old female with ulcerative colitis (hepatic flexure to rectum though most recently left-sided), uveitis, obesity,  B12 deficiency he is here for follow-up.  1.  UC (dx 2001) -- Seems to be responding to Humira. Constipation is likely related to improving if not resolved colitis. I recommended she continue Humira 40 mg every 14 days. Begin MiraLAX 17 g daily. Call if this is ineffective for constipation. Call if nausea persists. Follow-up in 6 months. --Recall colonoscopy 2 years given chronic colitis  2. Uveitis -- following closely with  ophthalmology at [facility]. She is now off steroids which is excellent and uveitis seems to be coming under control with Humira  25 minutes spent with the patient today. Greater than 50% was spent in counseling and coordination of care with the patient

## 2016-05-15 ENCOUNTER — Telehealth: Payer: Self-pay | Admitting: Internal Medicine

## 2016-05-15 NOTE — Telephone Encounter (Signed)
Spoke with pt and reviewed with her that good handwashing, taking vitamin C, Emergencee etc, and wearing a mask would be the best options for her to try and avoid the flu.

## 2016-05-24 ENCOUNTER — Telehealth: Payer: Self-pay | Admitting: Internal Medicine

## 2016-05-24 NOTE — Telephone Encounter (Signed)
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 She is due for her next Humira injection 06/03/16. Pt states she has some congestion, her ears are clogged, she has some drainage in her throat but has not had a fever. Pt wants to know if she should be tested for the flu and should she plan on Humira on 06/03/16. Please advise.

## 2016-05-24 NOTE — Telephone Encounter (Signed)
Would recommend PCP visit or urgent care for influenza swab Would make Humira decision closer to next dose date, have her update me around Feb 16th and we can decide re: Humira dosing

## 2016-05-24 NOTE — Telephone Encounter (Signed)
Spoke with pt and she is aware.

## 2016-06-01 ENCOUNTER — Telehealth: Payer: Self-pay | Admitting: Internal Medicine

## 2016-06-01 NOTE — Telephone Encounter (Signed)
Pyrtle pt with UC currently taking Humira. Pt called last week with flu-like symptoms and Dr. Hilarie Fredrickson instructed her to see her PCP and be tested for the flu, pt was to call back this week regarding Humira dose. Pt states she saw PCP and flu test was negative, she never ran a fever but she does still have a cough. Reports that in the am she is still coughing up phlegm and some mornings it is greenish yellow in color. Pt is due for Humira injection on 06/03/16 and she wants to know if it ok to proceed with Humira. Dr. Henrene Pastor as DOD please advise.

## 2016-06-01 NOTE — Telephone Encounter (Signed)
Pt knows to hold Humira. Dr. Hilarie Fredrickson when do you want pt to resume Humira? She was due 06/03/16. Please advise.

## 2016-06-01 NOTE — Telephone Encounter (Signed)
Dr. Hilarie Fredrickson is away until Tuesday. Given complaints of productive cough have her hold her Humira that is due in a few days. We should contact her again on Tuesday after Dr. Hilarie Fredrickson returns, see how she is feeling, and see what he prefers. If she is feeling well, he may decide to resume her Humira that day, but let's wait on his word. Thanks

## 2016-06-03 NOTE — Telephone Encounter (Signed)
Please check on patient Monday, if productive cough then order PA/LAT CXR If better, resume Humira at usual dose

## 2016-06-04 NOTE — Telephone Encounter (Signed)
Pt states she is still having the cough and it is productive but usually only in the am. States she saw her PCP this am and she placed her on an antibiotic, PCP did not do chest xray, stated she didn't fell pt needed it. Please advise regarding Humira.

## 2016-06-05 NOTE — Telephone Encounter (Signed)
Spoke with pt and she is aware.

## 2016-06-05 NOTE — Telephone Encounter (Signed)
Complete antibiotic course as prescribed If symptoms resolved and she is well after antibiotics, resume Humira the day after completing antibiotics and then every 14 days thereafter

## 2016-06-08 ENCOUNTER — Telehealth: Payer: Self-pay | Admitting: Internal Medicine

## 2016-06-08 NOTE — Telephone Encounter (Signed)
Spoke with pt and she is aware.

## 2016-06-08 NOTE — Telephone Encounter (Signed)
Pt states she should finish her antibiotic today and take Humira tomorrow. She reports she is still coughing and she only coughs something up in the mornings. States sometimes there is a little color (yellow) to it. Pt wants to know if she should proceed with Humira. Please advise.

## 2016-06-08 NOTE — Telephone Encounter (Signed)
Let's wait and resume on Monday assuming she continues to feel better Thanks JMP

## 2016-08-07 ENCOUNTER — Ambulatory Visit: Payer: BC Managed Care – PPO | Admitting: Internal Medicine

## 2016-08-20 ENCOUNTER — Encounter: Payer: Self-pay | Admitting: Internal Medicine

## 2016-08-20 ENCOUNTER — Ambulatory Visit (INDEPENDENT_AMBULATORY_CARE_PROVIDER_SITE_OTHER): Payer: BC Managed Care – PPO | Admitting: Internal Medicine

## 2016-08-20 VITALS — BP 128/66 | HR 56 | Ht 60.0 in | Wt 310.0 lb

## 2016-08-20 DIAGNOSIS — Z79899 Other long term (current) drug therapy: Secondary | ICD-10-CM

## 2016-08-20 DIAGNOSIS — K5100 Ulcerative (chronic) pancolitis without complications: Secondary | ICD-10-CM | POA: Diagnosis not present

## 2016-08-20 DIAGNOSIS — H209 Unspecified iridocyclitis: Secondary | ICD-10-CM

## 2016-08-20 NOTE — Progress Notes (Signed)
Subjective:    Patient ID: Vicki Burton, female    DOB: [birth year], 51 y.o.   MRN: [id number]  HPI Vicki Burton is a 51 year old female with ulcerative colitis (hepatic flexure to rectum though most recently left-sided), uveitis, obesity, B12 deficiency he is here for follow-up. She has been maintained on Humira since starting October 2017. She reports she's been doing very well. She is having regular bowel movements which are formed. Constipation has not been an issue of late though she was using MiraLAX on occasion for this symptom. MiraLAX helps when she needs it. She reports that she has no abdominal pain, tenesmus. No blood in her stool or melena. Her uveitis is also been well controlled with the Humira. Her ophthalmologist at [place] is leaving to return home to [place] and she will establish with a new ophthalmologist later this summer. No trouble with Humira injection site skin reactions or pain. She has been having trouble with her left wrist after falling in November. No other joint symptoms. No oral ulcers.  She is no longer needing Zofran and is not on omeprazole or any other acid suppression. No heartburn or trouble swallowing  Review of Systems As per history of present illness, otherwise negative  Current Medications, Allergies, Past Medical History, Past Surgical History, Family History and Social History were reviewed in [employer] record.     Objective:   Physical Exam  BP 128/66   Pulse (!) 56   Ht 5' (1.524 m)   Wt (!) 310 lb (140.6 kg)   BMI 60.54 kg/m   Constitutional: Well-developed and well-nourished. No distress. HEENT: Normocephalic and atraumatic. Oropharynx is clear and moist. Conjunctivae are normal.  No scleral icterus. Neck: Neck supple. Trachea midline. Cardiovascular: Normal rate, regular rhythm and intact distal pulses. No M/R/G Pulmonary/chest: Effort normal and breath sounds normal. No wheezing, rales or rhonchi. Abdominal:  Soft, nontender, nondistended. Bowel sounds active throughout. There are no masses palpable. No hepatosplenomegaly. Extremities: no clubbing, cyanosis, or edema Neurological: Alert and oriented to person place and time. Skin: Skin is warm and dry. Psychiatric: Normal mood and affect. Behavior is normal.  CBC    Component Value Date/Time   WBC 5.8 04/06/2016 1205   RBC 4.39 04/06/2016 1205   HGB 12.8 04/06/2016 1205   HCT 37.5 04/06/2016 1205   PLT 321.0 04/06/2016 1205   MCV 85.4 04/06/2016 1205   MCHC 34.1 04/06/2016 1205   RDW 17.8 (H) 04/06/2016 1205   LYMPHSABS 2.8 04/06/2016 1205   MONOABS 0.5 04/06/2016 1205   EOSABS 0.2 04/06/2016 1205   BASOSABS 0.0 04/06/2016 1205   CMP     Component Value Date/Time   NA 138 04/06/2016 1205   K 3.8 04/06/2016 1205   CL 105 04/06/2016 1205   CO2 30 04/06/2016 1205   GLUCOSE 96 04/06/2016 1205   BUN 20 04/06/2016 1205   CREATININE 0.87 04/06/2016 1205   CALCIUM 9.5 04/06/2016 1205   PROT 8.1 04/06/2016 1205   ALBUMIN 4.3 04/06/2016 1205   AST 21 04/06/2016 1205   ALT 24 04/06/2016 1205   ALKPHOS 54 04/06/2016 1205   BILITOT 0.4 04/06/2016 1205      Assessment & Plan:  51 year old female with ulcerative colitis (hepatic flexure to rectum though most recently left-sided), uveitis, obesity, B12 deficiency he is here for follow-up  1. Panulcerative colitis (dx 2001) -- symptoms seem to be completely controlled with Humira. We'll continue Humira 40 mg every 14 days. Constipation no longer  an issue but can use MiraLAX safely 17 g daily as needed. Previous nausea has resolved and she has not using Zofran. --Repeat quantiferon gold in September 2018 --Repeat surveillance colonoscopy September 2019  2. Uveitis -- improved on Humira. Following with [facility]  25 minutes spent with the patient today. Greater than 50% was spent in counseling and coordination of care with the patient

## 2016-08-20 NOTE — Patient Instructions (Signed)
Please continue Humira 40 mg every 14 days.  Follow up with Dr Hilarie Fredrickson in 9 months.  You will be due for TB testing in September 2018. We will contact you when it gets closer to that time.  If you are age 53 or older, your body mass index should be between 23-30. Your Body mass index is 60.54 kg/m. If this is out of the aforementioned range listed, please consider follow up with your Primary Care Provider.  If you are age 40 or younger, your body mass index should be between 19-25. Your Body mass index is 60.54 kg/m. If this is out of the aformentioned range listed, please consider follow up with your Primary Care Provider.

## 2016-08-21 ENCOUNTER — Telehealth: Payer: Self-pay | Admitting: Internal Medicine

## 2016-08-21 NOTE — Telephone Encounter (Signed)
Letter faxed for pt per her request.

## 2017-01-06 ENCOUNTER — Encounter: Payer: Self-pay | Admitting: Internal Medicine

## 2017-01-07 ENCOUNTER — Other Ambulatory Visit: Payer: Self-pay | Admitting: Internal Medicine

## 2017-01-07 ENCOUNTER — Other Ambulatory Visit: Payer: Self-pay

## 2017-01-07 MED ORDER — HUMIRA PEN 40 MG/0.8ML ~~LOC~~ PNKT: 40.0000 mg | PEN_INJECTOR | SUBCUTANEOUS | 6 refills | Status: DC

## 2017-01-07 NOTE — Telephone Encounter (Signed)
I would delay the Humira shot for several more days. Once she is feel better, assuming still no fever, she should take the injection and then stay on the every 14 day schedule from there Would take as soon as she is feeling definitively better

## 2017-01-14 ENCOUNTER — Encounter: Payer: Self-pay | Admitting: Internal Medicine

## 2017-05-22 ENCOUNTER — Telehealth: Payer: Self-pay | Admitting: Internal Medicine

## 2017-05-22 NOTE — Telephone Encounter (Signed)
Patient has been diagnosed with this flu. She is due to take her Humira injection on Sunday and wants to know what she should do. Please advise.

## 2017-05-24 NOTE — Telephone Encounter (Signed)
Spoke with pt and she is aware.

## 2017-05-24 NOTE — Telephone Encounter (Signed)
Would hold the Humira while she feels sick and while she has a fever Once she feels better and has been without fever x 48 hours, she can resume Humira and then go every 14 days from there I hope she feels better soon

## 2017-06-07 ENCOUNTER — Telehealth: Payer: Self-pay | Admitting: Internal Medicine

## 2017-06-07 NOTE — Telephone Encounter (Signed)
Spoke with pt and she is aware.

## 2017-06-07 NOTE — Telephone Encounter (Signed)
Vicki Burton pt with UC on Humira. Called 05/22/17 stating she had the flu. See below with Dr. Vena Rua recommendation.  Vicki Burton, Vicki Lines, MD      9:32 AM  Note    Would hold the Humira while she feels sick and while she has a fever Once she feels better and has been without fever x 48 hours, she can resume Humira and then go every 14 days from there I hope she feels better soon      Today pt called and states she was diagnosed with pneumonia. She has been placed on antibiotics and prednisone. She has not been able take her Humira since she got he flu. Pt calling to see what she needs to do regarding when she should take Humira. Pt has not had any fever. Dr. Edison Nasuti as DOD please advise.

## 2017-06-07 NOTE — Telephone Encounter (Signed)
She should continue to not take the Humira until she has completed the pneumonia antibiotics.

## 2017-08-05 ENCOUNTER — Telehealth: Payer: Self-pay | Admitting: Internal Medicine

## 2017-08-05 NOTE — Telephone Encounter (Signed)
Spoke with pt and let her know that she had the Hep C antibody test drawn a year ago and it was negative. Pt aware and she does not need to have this drawn again.

## 2017-11-21 ENCOUNTER — Encounter: Payer: Self-pay | Admitting: Internal Medicine

## 2017-11-25 ENCOUNTER — Other Ambulatory Visit: Payer: Self-pay | Admitting: Internal Medicine

## 2018-01-09 ENCOUNTER — Other Ambulatory Visit: Payer: Self-pay

## 2018-02-13 ENCOUNTER — Other Ambulatory Visit: Payer: Self-pay | Admitting: Internal Medicine

## 2018-03-25 ENCOUNTER — Other Ambulatory Visit: Payer: Self-pay | Admitting: Internal Medicine

## 2018-03-25 ENCOUNTER — Telehealth: Payer: Self-pay | Admitting: Internal Medicine

## 2018-03-25 MED ORDER — ADALIMUMAB 40 MG/0.4ML ~~LOC~~ AJKT: 40.0000 mg | AUTO-INJECTOR | SUBCUTANEOUS | 2 refills | Status: DC

## 2018-03-25 NOTE — Telephone Encounter (Signed)
Patient states she needs her humira pen refilled by [facility] and if there is a problem refilling it, pt requests calling her mobile number.

## 2018-03-25 NOTE — Telephone Encounter (Signed)
Spoke with pt and let her know refill sent to pharmacy but she needed an OV with Dr. Hilarie Fredrickson. Pt scheduled to see Dr. Hilarie Fredrickson 05/06/18@8 :30am. Pt aware of appt.

## 2018-05-06 ENCOUNTER — Encounter: Payer: Self-pay | Admitting: Internal Medicine

## 2018-05-06 ENCOUNTER — Ambulatory Visit: Payer: BC Managed Care – PPO | Admitting: Internal Medicine

## 2018-05-06 ENCOUNTER — Other Ambulatory Visit (INDEPENDENT_AMBULATORY_CARE_PROVIDER_SITE_OTHER): Payer: BC Managed Care – PPO

## 2018-05-06 VITALS — BP 110/70 | HR 60 | Ht 60.5 in | Wt 322.1 lb

## 2018-05-06 DIAGNOSIS — K5190 Ulcerative colitis, unspecified, without complications: Secondary | ICD-10-CM

## 2018-05-06 DIAGNOSIS — E538 Deficiency of other specified B group vitamins: Secondary | ICD-10-CM | POA: Diagnosis not present

## 2018-05-06 DIAGNOSIS — Z79899 Other long term (current) drug therapy: Secondary | ICD-10-CM | POA: Diagnosis not present

## 2018-05-06 LAB — CBC WITH DIFFERENTIAL/PLATELET
BASOS ABS: 0.1 10*3/uL (ref 0.0–0.1)
Basophils Relative: 0.9 % (ref 0.0–3.0)
Eosinophils Absolute: 0.2 10*3/uL (ref 0.0–0.7)
Eosinophils Relative: 2.9 % (ref 0.0–5.0)
HCT: 42.3 % (ref 36.0–46.0)
Hemoglobin: 14 g/dL (ref 12.0–15.0)
Lymphocytes Relative: 41.6 % (ref 12.0–46.0)
Lymphs Abs: 2.3 10*3/uL (ref 0.7–4.0)
MCHC: 33.1 g/dL (ref 30.0–36.0)
MCV: 92.9 fl (ref 78.0–100.0)
Monocytes Absolute: 0.5 10*3/uL (ref 0.1–1.0)
Monocytes Relative: 9 % (ref 3.0–12.0)
NEUTROS ABS: 2.5 10*3/uL (ref 1.4–7.7)
Neutrophils Relative %: 45.6 % (ref 43.0–77.0)
PLATELETS: 317 10*3/uL (ref 150.0–400.0)
RBC: 4.56 Mil/uL (ref 3.87–5.11)
RDW: 15.1 % (ref 11.5–15.5)
WBC: 5.4 10*3/uL (ref 4.0–10.5)

## 2018-05-06 LAB — COMPREHENSIVE METABOLIC PANEL
ALT: 25 U/L (ref 0–35)
AST: 20 U/L (ref 0–37)
Albumin: 4.2 g/dL (ref 3.5–5.2)
Alkaline Phosphatase: 53 U/L (ref 39–117)
BILIRUBIN TOTAL: 0.4 mg/dL (ref 0.2–1.2)
BUN: 15 mg/dL (ref 6–23)
CO2: 30 mEq/L (ref 19–32)
Calcium: 9.4 mg/dL (ref 8.4–10.5)
Chloride: 102 mEq/L (ref 96–112)
Creatinine, Ser: 1.01 mg/dL (ref 0.40–1.20)
GFR: 68.91 mL/min (ref >60.00)
Glucose, Bld: 107 mg/dL — ABNORMAL HIGH (ref 70–99)
Potassium: 3.7 mEq/L (ref 3.5–5.1)
Sodium: 140 mEq/L (ref 135–145)
Total Protein: 7.9 g/dL (ref 6.0–8.3)

## 2018-05-06 LAB — VITAMIN B12: Vitamin B-12: 878 pg/mL (ref 211–911)

## 2018-05-06 NOTE — Progress Notes (Signed)
   Subjective:    Patient ID: Vicki Burton, female    DOB: 12-Feb-1966, 53 y.o.   MRN: [id number]  HPI Chanie Soucek is a 53 year old female with ulcerative colitis (hepatic flexure to rectum) diagnosed in 2001, history of uveitis, B12 deficiency and obesity who is here for follow-up.  She was last seen on 08/20/2016 and she is here alone today.  She has been maintained on Humira 40 mg every 14 days which started November 2017.  She reports that this medication has worked very well for her.  She is not having any colitis symptoms currently.  Her uveitis is also been in remission on this medication.  She denies abdominal pain.  No diarrhea or constipation.  No blood in her stool or melena.  No rectal pain or tenesmus.  No rashes, ocular complaints.  She recently switched to the citrate free version of Humira and is having absolutely no discomfort.  No injection site reactions.  Previously was having nausea and this has also improved dramatically.   Review of Systems As per HPI, otherwise negative  Current Medications, Allergies, Past Medical History, Past Surgical History, Family History and Social History were reviewed in [employer] record.     Objective:   Physical Exam BP 110/70 (BP Location: Left Arm, Patient Position: Sitting, Cuff Size: Normal)   Pulse 60   Ht 5' 0.5" (1.537 m) Comment: height measured without shoes  Wt (!) 322 lb 2 oz (146.1 kg)   LMP 04/29/2018   BMI 61.88 kg/m  Constitutional: Well-developed and well-nourished. No distress. HEENT: Normocephalic and atraumatic.  Conjunctivae are normal.  No scleral icterus. Neck: Neck supple. Trachea midline. Cardiovascular: Normal rate, regular rhythm and intact distal pulses. No M/R/G Pulmonary/chest: Effort normal and breath sounds normal. No wheezing, rales or rhonchi. Abdominal: Soft, obese, nontender, nondistended. Bowel sounds active throughout.  Extremities: no clubbing, cyanosis, or  edema Neurological: Alert and oriented to person place and time. Skin: Skin is warm and dry. Psychiatric: Normal mood and affect. Behavior is normal.  Labs pending from today    Assessment & Plan:  53 year old female with ulcerative colitis (hepatic flexure to rectum) diagnosed in 2001, history of uveitis, B12 deficiency and obesity who is here for follow-up.  1.  Pan ulcerative colitis diagnosed in 2001 --she has maintained clinical remission on Humira.  We will continue Humira 40 mg every 14 days.  She has received her influenza vaccination this year.  Surveillance colonoscopy is due at this time.  She prefers to wait till summer 2020 when she is out of school. --Continue Humira 40 mg every 14 days --CBC, CMP, QuantiFERON gold today --Surveillance colonoscopy recommended, she would like to schedule in June 2020.  This will need to be performed in the outpatient hospital setting due to BMI greater than 50.  2.  Uveitis --improved on Humira.  Following with [facility].  3.  B12 deficiency --on oral B12.  Check B12 level today  Follow-up in the office every 12 to 18 months, sooner if needed  15 minutes spent with the patient today. Greater than 50% was spent in counseling and coordination of care with the patient

## 2018-05-06 NOTE — Patient Instructions (Addendum)
If you are age 55 or older, your body mass index should be between 23-30. Your Body mass index is 61.88 kg/m. If this is out of the aforementioned range listed, please consider follow up with your Primary Care Provider.  If you are age 75 or younger, your body mass index should be between 19-25. Your Body mass index is 61.88 kg/m. If this is out of the aformentioned range listed, please consider follow up with your Primary Care Provider.     Your provider has requested that you go to the basement level for lab work before leaving today. Press "B" on the elevator. The lab is located at the first door on the left as you exit the elevator.   We will contact you regarding colonoscopy that needs to be scheduled in May 2020.    Continue Humira every 14 days as directed.

## 2018-05-08 LAB — QUANTIFERON-TB GOLD PLUS
NIL: 0.07 IU/mL
QuantiFERON-TB Gold Plus: POSITIVE — AB
TB1-NIL: 0.31 [IU]/mL
TB2-NIL: 0.66 IU/mL

## 2018-05-09 ENCOUNTER — Other Ambulatory Visit: Payer: Self-pay

## 2018-05-09 DIAGNOSIS — R7612 Nonspecific reaction to cell mediated immunity measurement of gamma interferon antigen response without active tuberculosis: Secondary | ICD-10-CM

## 2018-05-22 ENCOUNTER — Encounter: Payer: Self-pay | Admitting: Internal Medicine

## 2018-05-22 ENCOUNTER — Telehealth: Payer: Self-pay

## 2018-05-22 ENCOUNTER — Ambulatory Visit
Admission: RE | Admit: 2018-05-22 | Discharge: 2018-05-22 | Disposition: A | Discharge status: Home / Self Care | Payer: BC Managed Care – PPO | Source: Ambulatory Visit | Attending: Internal Medicine | Admitting: Internal Medicine

## 2018-05-22 ENCOUNTER — Ambulatory Visit: Payer: BC Managed Care – PPO | Admitting: Internal Medicine

## 2018-05-22 DIAGNOSIS — Z227 Latent tuberculosis: Secondary | ICD-10-CM | POA: Diagnosis not present

## 2018-05-22 MED ORDER — ISONIAZID 300 MG PO TABS: 300.0000 mg | ORAL_TABLET | Freq: Every day | ORAL | 8 refills | Status: DC

## 2018-05-22 MED ORDER — PYRIDOXINE HCL 50 MG PO TABS: 50.0000 mg | ORAL_TABLET | Freq: Every day | ORAL | 8 refills | Status: DC

## 2018-05-22 NOTE — Progress Notes (Signed)
[facility]      Reason for Consult: Latent Tb    Referring Physician: Dr. Hilarie Fredrickson    Patient ID: Vicki Burton, female    DOB: 12-Sep-1965, 53 y.o.   MRN: [id number]  HPI:   Here for evaluation of a positive Quantiferon Gold test.   She has a history of ulcerative colitis and uveitis on Humira and followed by Dr Hilarie Fredrickson.  She had a previous test that was negative in 2017 but most recent test is a positive Quantiferon Gold test.  No known exposure to Tb, no current productive cough, weight loss.  No risk factors including no jail, known person with Tb.  History of uveitis so she has continued to take Humira after discussion with [place].   Currently has viral bronchitis and being treated with an antibioitic ('Zpack') per her primary physician.    Past Medical History:  Diagnosis Date  . Family history of malignant neoplasm of gastrointestinal tract    maternal great aunt  . Iron deficiency anemia, unspecified   . Obesity, unspecified   . Other and unspecified noninfectious gastroenteritis and colitis(558.9)   . Ovarian cyst   . TMJ (dislocation of temporomandibular joint)   . Ulcerative colitis, unspecified   . Unspecified essential hypertension   . Unspecified iridocyclitis   . Vitamin B12 deficiency     Prior to Admission medications   Medication Sig Start Date End Date Taking? Authorizing Provider  acetaminophen (TYLENOL) 500 MG tablet Take 1,000 mg by mouth as needed for mild pain or headache.     [provider]  Adalimumab (HUMIRA PEN) 40 MG/0.4ML PNKT Inject 40 mg into the skin every 14 (fourteen) days. 03/25/18   Pyrtle, Lajuan Lines, MD  atenolol (TENORMIN) 50 MG tablet Take 50 mg by mouth every morning.    [provider]  Biotin 1 MG CAPS Take by mouth every morning.     [provider]  cetirizine (ZYRTEC) 10 MG tablet daily at 2 PM. 04/04/16   [provider]  chlorthalidone (HYGROTON) 25 MG tablet daily at 2 PM.  01/19/16   [provider]  cholecalciferol (VITAMIN D) 1000 units tablet Take 1,000 Units by mouth daily.    [provider]  fluticasone Asencion Islam) 50 MCG/ACT nasal spray Place 2 sprays into both nostrils every morning.  11/02/13 11/02/14  [provider]  HUMIRA PEN 40 MG/0.8ML PNKT Inject 40 mg into the skin as directed. Every other week 01/07/17   Pyrtle, Lajuan Lines, MD  HUMIRA PEN 42 MG/0.8ML PNKT INJECT ONE PEN SUBCUTANEOUSLY EVERY OTHER WEEK. REFRIGERATE. 02/13/18   Pyrtle, Lajuan Lines, MD  hydrochlorothiazide (HYDRODIURIL) 25 MG tablet Take 25 mg by mouth every morning.    [provider]  ibuprofen (ADVIL,MOTRIN) 200 MG tablet Take 400 mg by mouth every 8 (eight) hours as needed for headache or mild pain.    [provider]  Multiple Vitamins-Minerals (MULTIVITAMIN WITH MINERALS) tablet Take 1 tablet by mouth daily.    [provider]  OVER THE COUNTER MEDICATION     [provider]  polyethylene glycol powder (MIRALAX) powder Take 1 Container by mouth once. For colon 11-6    [provider]  potassium chloride (K-DUR,KLOR-CON) 10 MEQ tablet Take by mouth daily at 2 PM. 12/12/15 12/11/16  [provider]  vitamin B-12 (CYANOCOBALAMIN) 100 MCG tablet Take 100 mcg by mouth daily.    [provider]    Allergies  Allergen  Reactions  . Amlodipine Swelling    Lips/face  . Lisinopril   . Sulfonamide Derivatives     Social History   Tobacco Use  . Smoking status: Never Smoker  . Smokeless tobacco: Never Used  Substance Use Topics  . Alcohol use: No  . Drug use: No    Family History  Problem Relation Age of Onset  . Diabetes Father   . Heart disease Father   . Colon cancer Other        maternal great Aunt  . Dementia Mother   . Rectal cancer Neg Hx   . Stomach cancer Neg Hx     Review of Systems  Constitutional: negative for fevers, chills, sweats, fatigue, anorexia and weight loss Respiratory:  negative for sputum, wheezing or dyspnea on exertion Gastrointestinal: negative for nausea and diarrhea All other systems reviewed and are negative    Constitutional: in no apparent distress  Blood pressure (!) 176/90, temperature (!) 97.4 F (36.3 C), temperature source Oral, height 5' (1.524 m), weight (!) 320 lb 12 oz (145.5 kg), last menstrual period 04/28/2018. EYES: anicteric ENMT:no thrush Cardiovascular: Cor RRR Respiratory: CTA B; normal respiratory effort GI: Bowel sounds are normal, liver is not enlarged, spleen is not enlarged Musculoskeletal: no pedal edema noted Skin: negatives: no rash Neuro: non-focal  Labs: Lab Results  Component Value Date   WBC 5.4 05/06/2018   HGB 14.0 05/06/2018   HCT 42.3 05/06/2018   MCV 92.9 05/06/2018   PLT 317.0 05/06/2018    Lab Results  Component Value Date   CREATININE 1.01 05/06/2018   BUN 15 05/06/2018   NA 140 05/06/2018   K 3.7 05/06/2018   CL 102 05/06/2018   CO2 30 05/06/2018    Lab Results  Component Value Date   ALT 25 05/06/2018   AST 20 05/06/2018   ALKPHOS 53 05/06/2018   BILITOT 0.4 05/06/2018     Assessment: Latent Tb, 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  I discussed the differences between active and latent.  No concerning history or symptoms.  Will use 9 months of INH with B6 since Humira will interact with rifampin or rifapentin.  OK from ID standpoint to continue with Humira unless she is intolerant of INH.    Plan: 1) CXR 2) HIV 3) baseline CMP, CBC 4) rtc 3 weeks

## 2018-05-22 NOTE — Telephone Encounter (Signed)
-----   Message from Jerene Bears, MD sent at 05/22/2018  4:36 PM EST ----- Pls let pt know that I saw the note from ID She will be treated for latent TB I recommend she continue Humira at current dose and freq.  This is also okay with ID JMP ----- Message ----- From: Thayer Headings, MD Sent: 05/22/2018   2:22 PM EST To: Jerene Bears, MD

## 2018-05-22 NOTE — Telephone Encounter (Signed)
Spoke with pt and she is aware.

## 2018-05-23 LAB — COMPREHENSIVE METABOLIC PANEL
AG Ratio: 1.3 (calc) (ref 1.0–2.5)
ALT: 29 U/L (ref 6–29)
AST: 23 U/L (ref 10–35)
Albumin: 4.2 g/dL (ref 3.6–5.1)
Alkaline phosphatase (APISO): 53 U/L (ref 37–153)
BUN: 14 mg/dL (ref 7–25)
CO2: 32 mmol/L (ref 20–32)
Calcium: 10 mg/dL (ref 8.6–10.4)
Chloride: 103 mmol/L (ref 98–110)
Creat: 0.98 mg/dL (ref 0.50–1.05)
Globulin: 3.2 g/dL (calc) (ref 1.9–3.7)
Glucose, Bld: 112 mg/dL — ABNORMAL HIGH (ref 65–99)
Potassium: 3.8 mmol/L (ref 3.5–5.3)
SODIUM: 143 mmol/L (ref 135–146)
Total Bilirubin: 0.4 mg/dL (ref 0.2–1.2)
Total Protein: 7.4 g/dL (ref 6.1–8.1)

## 2018-05-23 LAB — CBC
HCT: 40.6 % (ref 35.0–45.0)
Hemoglobin: 13.7 g/dL (ref 11.7–15.5)
MCH: 30.9 pg (ref 27.0–33.0)
MCHC: 33.7 g/dL (ref 32.0–36.0)
MCV: 91.4 fL (ref 80.0–100.0)
MPV: 11.3 fL (ref 7.5–12.5)
Platelets: 321 10*3/uL (ref 140–400)
RBC: 4.44 10*6/uL (ref 3.80–5.10)
RDW: 13.3 % (ref 11.0–15.0)
WBC: 5.8 10*3/uL (ref 3.8–10.8)

## 2018-05-23 LAB — HIV ANTIBODY (ROUTINE TESTING W REFLEX): HIV 1&2 Ab, 4th Generation: NONREACTIVE

## 2018-06-10 ENCOUNTER — Encounter: Payer: Self-pay | Admitting: Internal Medicine

## 2018-06-10 ENCOUNTER — Ambulatory Visit: Payer: BC Managed Care – PPO | Admitting: Internal Medicine

## 2018-06-10 DIAGNOSIS — Z5181 Encounter for therapeutic drug level monitoring: Secondary | ICD-10-CM | POA: Insufficient documentation

## 2018-06-10 DIAGNOSIS — Z227 Latent tuberculosis: Secondary | ICD-10-CM | POA: Diagnosis not present

## 2018-06-10 NOTE — Progress Notes (Signed)
   Subjective:    Patient ID: Vicki Burton, female    DOB: February 17, 1966, 53 y.o.   MRN: [id number]  HPI Here for follow up of latent Tb Started on INH with B6 and no issues.  HIV negative.  On Humira and has continued for her UC.   No associated rash.     Review of Systems  Gastrointestinal: Negative for diarrhea.  Skin: Negative for rash.       Objective:   Physical Exam Constitutional:      Appearance: Normal appearance.  Cardiovascular:     Rate and Rhythm: Normal rate and regular rhythm.  Pulmonary:     Effort: Pulmonary effort is normal.     Breath sounds: Normal breath sounds. No wheezing.  Skin:    Findings: No rash.  Neurological:     Mental Status: She is alert.           Assessment & Plan:

## 2018-06-10 NOTE — Assessment & Plan Note (Signed)
Will check cmp today

## 2018-06-10 NOTE — Assessment & Plan Note (Addendum)
Doing well on INH.  Will continue for 9 months. She will return near the end of treatment

## 2018-06-11 LAB — COMPLETE METABOLIC PANEL WITH GFR
AG RATIO: 1.2 (calc) (ref 1.0–2.5)
ALT: 31 U/L — ABNORMAL HIGH (ref 6–29)
AST: 32 U/L (ref 10–35)
Albumin: 4.1 g/dL (ref 3.6–5.1)
Alkaline phosphatase (APISO): 47 U/L (ref 37–153)
BUN/Creatinine Ratio: 17 (calc) (ref 6–22)
BUN: 20 mg/dL (ref 7–25)
CALCIUM: 9.4 mg/dL (ref 8.6–10.4)
CO2: 27 mmol/L (ref 20–32)
Chloride: 102 mmol/L (ref 98–110)
Creat: 1.21 mg/dL — ABNORMAL HIGH (ref 0.50–1.05)
GFR, Est African American: 59 mL/min/{1.73_m2} — ABNORMAL LOW (ref >=60)
GFR, Est Non African American: 51 mL/min/{1.73_m2} — ABNORMAL LOW (ref >=60)
Globulin: 3.3 g/dL (calc) (ref 1.9–3.7)
Glucose, Bld: 94 mg/dL (ref 65–99)
Potassium: 4.2 mmol/L (ref 3.5–5.3)
Sodium: 139 mmol/L (ref 135–146)
Total Bilirubin: 0.4 mg/dL (ref 0.2–1.2)
Total Protein: 7.4 g/dL (ref 6.1–8.1)

## 2018-06-18 ENCOUNTER — Other Ambulatory Visit: Payer: Self-pay | Admitting: Internal Medicine

## 2018-06-18 ENCOUNTER — Other Ambulatory Visit: Payer: Self-pay | Admitting: *Deleted

## 2018-06-18 DIAGNOSIS — Z227 Latent tuberculosis: Secondary | ICD-10-CM

## 2018-06-18 MED ORDER — PYRIDOXINE HCL 50 MG PO TABS: 50.0000 mg | ORAL_TABLET | Freq: Every day | ORAL | 7 refills | Status: DC

## 2018-06-18 MED ORDER — ISONIAZID 300 MG PO TABS: 300.0000 mg | ORAL_TABLET | Freq: Every day | ORAL | 7 refills | Status: DC

## 2018-09-03 ENCOUNTER — Other Ambulatory Visit: Payer: Self-pay | Admitting: Internal Medicine

## 2018-09-24 ENCOUNTER — Telehealth: Payer: Self-pay | Admitting: *Deleted

## 2018-09-24 NOTE — Telephone Encounter (Signed)
I have left a message at patient's mobile number. She is due for recall colonoscopy for IBD surveillence. Case will need to be completed at hospital due to BMI>50. I currently have availability on 10/06/18. Will await a return call.

## 2018-09-26 NOTE — Telephone Encounter (Signed)
Patient indicates that she is unable to have hospital procedure on 10/06/18 as she has to take her father in law to an appointment. She also may have jury duty on 10/20/18, our other available hospital block date so she is not sure she would be able to have procedure that date. She indicates that she is currently trying to get a waiver so she will not need to do jury duty due to her IBD, but has not yet heard back from the court system. She indicates that she will call me back when she knows something further in regards to this and whether she may be able to have colonoscopy procedure on 10/20/18.

## 2018-10-16 NOTE — Telephone Encounter (Signed)
Patient has been offered colonoscopy appointment for 12/01/18, however she indicates that she cannot have test that day because it is the first day of school. She also tells me that she recently tested positive for COVID-19 after being exposed while at work. She indicates that she only had mild cough and sinus/cold symptoms. Diagnosis was 2 weeks ago. I asked patient if she had held her Humira and she tells me that she took her Humira on Sunday.

## 2018-10-20 NOTE — Telephone Encounter (Signed)
Ok, it will be up to her to prioritize her health care/colonoscopy as it relates to her work/life schedule We can offer a date in Sept when that schedule is available for hospital colonoscopy

## 2018-10-29 ENCOUNTER — Telehealth: Payer: Self-pay | Admitting: Internal Medicine

## 2018-10-29 NOTE — Telephone Encounter (Signed)
Pt wants to know Dr. Vena Rua thoughts on her returning to school. States she is on Humira and has already had latent TB and had Covid-19 in June. She is concerned. If she decides not to go back she will need a note from physician. Please advise.

## 2018-10-31 NOTE — Telephone Encounter (Signed)
Very tough call here, but I certainly understand her fears The latent TB is treated and recovery from [place] 19 should confer immunity. I also think time will tell if school will be in person or virtual I would not be opposed to a work note, but probably best to discuss with her by phone or inperson, would schedule OV

## 2018-11-03 NOTE — Telephone Encounter (Signed)
Spoke with pt and she is aware. She would prefer virtual visit. Pt scheduled for virtual visit with Dr. Hilarie Fredrickson 12/09/18@9 :50am. Pt aware of appt.

## 2018-11-20 NOTE — Telephone Encounter (Signed)
I have again contacted patient to offer her a colonoscopy at hospital on 12/23/18, the only available hospital date at this time. Patient states she is not able to have procedure on this date because she does not know when her school will be back in session and she would not be able to quarantine for 3 days prior to her procedure with school just having started back. She notes that she would like to have procedure completed because she knows it is overdue. She is currently scheduled for an office visit with Dr Hilarie Fredrickson on 12/09/18 so this can be discussed at that time.

## 2018-12-03 ENCOUNTER — Encounter: Payer: Self-pay | Admitting: *Deleted

## 2018-12-08 ENCOUNTER — Encounter: Payer: Self-pay | Admitting: Internal Medicine

## 2018-12-09 ENCOUNTER — Ambulatory Visit (INDEPENDENT_AMBULATORY_CARE_PROVIDER_SITE_OTHER): Payer: BC Managed Care – PPO | Admitting: Internal Medicine

## 2018-12-09 ENCOUNTER — Encounter: Payer: Self-pay | Admitting: Internal Medicine

## 2018-12-09 VITALS — Ht 61.0 in | Wt 320.0 lb

## 2018-12-09 DIAGNOSIS — Z227 Latent tuberculosis: Secondary | ICD-10-CM

## 2018-12-09 DIAGNOSIS — K5190 Ulcerative colitis, unspecified, without complications: Secondary | ICD-10-CM

## 2018-12-09 DIAGNOSIS — E538 Deficiency of other specified B group vitamins: Secondary | ICD-10-CM

## 2018-12-09 DIAGNOSIS — Z8669 Personal history of other diseases of the nervous system and sense organs: Secondary | ICD-10-CM

## 2018-12-09 DIAGNOSIS — Z79899 Other long term (current) drug therapy: Secondary | ICD-10-CM

## 2018-12-09 NOTE — Progress Notes (Signed)
Subjective:   This service was provided via telemedicine.  Doximity App with AV communication  The patient was located in her car The provider was located in provider's GI office. The patient did consent to this telephone visit and is aware of possible charges through their insurance for this visit.   The persons participating in this telemedicine service were the patient and I. Time spent on call: 15 min    Patient ID: Vicki Burton, female    DOB: 1965/06/20, 53 y.o.   MRN: [id number]  HPI Vicki Burton is a 53 year old female with a history of ulcerative colitis diagnosed in 2001 (hepatic flexure to rectum), history of uveitis, B12 deficiency, morbid obesity, latent tuberculosis on INH therapy and COVID-19 infection earlier this year who is seen for follow-up.  She is seen virtually today through the Doximity application in the setting of COVID-19.  After I saw her last in January 2020 her QuantiFERON gold was positive.  She was seen by Dr. Linus Salmons and has been on INH therapy plus B6 with planning 9 months of therapy.  Her Humira has been resumed.  She reports she has been doing well.  She has not had any symptoms of active colitis.  No abdominal pain.  No blood in her stool.  No melena.  No diarrhea or constipation.  She denies rectal pain or tenesmus.  No recent ocular complaint.  She did miss 1 or 2 doses of Humira when she was diagnosed with COVID-19 in June 2020.  She did have some eye discomfort while Humira was interrupted.  This has resolved completely.  She was exposed to COVID-19 at work and contacted by the health department.  Her father also was diagnosed with COVID-19 and though she was not having considerable symptoms she was tested and tested positive.  She did have a headache but denies fever.  She later developed a cough which has resolved.   Review of Systems As per HPI, otherwise negative  Current Medications, Allergies, Past Medical History, Past Surgical History, Family  History and Social History were reviewed in [employer] record.     Objective:   Physical Exam No physical exam today, virtual visit       Assessment & Plan:   53 year old female with a history of ulcerative colitis diagnosed in 2001 (hepatic flexure to rectum), history of uveitis, B12 deficiency, morbid obesity, latent tuberculosis on INH therapy and COVID-19 infection earlier this year who is seen for follow-up.  1.  Pan ulcerative colitis diagnosed in 2001 --she is doing well and has maintained clinical remission on Humira.  This is also work for her uveitis.  Unfortunately she was diagnosed with latent TB but is tolerating isoniazid therapy and has about 1 month left of therapy. --Continue Humira 40 mg every 14 days --Surveillance colonoscopy is recommended and is due at this time.  This will need to be done in the outpatient hospital setting due to her BMI.  I recommended that we proceed with this and after discussing the risk and benefits and alternatives she is agreeable and wishes to proceed.  We will target October at [facility] long with MAC  2.  Uveitis --improved on Humira.  She follows with [facility]  3.  Latent tuberculosis --on isoniazid therapy plus B6.  She will follow with Dr. Linus Salmons in October.  4.  B12 deficiency --she has been off of B12 recently and I recommend that she resume this orally at 1000 mcg daily.

## 2018-12-10 ENCOUNTER — Telehealth: Payer: Self-pay | Admitting: *Deleted

## 2018-12-10 NOTE — Telephone Encounter (Signed)
Patient has been scheduled for hospital colonoscopy on 01/19/2019 at 945 am with 815 am arrival [facility]). She is scheduled for COVID testing on 01/15/19 at the [facility] location and is also scheduled for previsit on 01/15/19 at 10:00 am to go over colonoscopy instructions (this way she can have all testing/instructions completed with 1 trip as she lives about 45 minutes from our office). Patient verbalizes understanding.

## 2018-12-10 NOTE — Patient Instructions (Signed)
Continue Humira 40 mg every 14 days.  Follow up with [facility] regarding your recent uveitis flare up.  Follow up with Dr. Linus Salmons in October regarding your latent TB.  Resume oral Vitamin B12 at 1000 mcg daily.  If you are age 55 or older, your body mass index should be between 23-30. Your Body mass index is 60.46 kg/m. If this is out of the aforementioned range listed, please consider follow up with your Primary Care Provider.  If you are age 78 or younger, your body mass index should be between 19-25. Your Body mass index is 60.46 kg/m. If this is out of the aformentioned range listed, please consider follow up with your Primary Care Provider.

## 2018-12-10 NOTE — Addendum Note (Signed)
Addended by: Larina Bras on: 12/10/2018 02:58 PM   Modules accepted: Orders, SmartSet

## 2019-01-06 ENCOUNTER — Other Ambulatory Visit: Payer: Self-pay | Admitting: Internal Medicine

## 2019-01-13 ENCOUNTER — Encounter (HOSPITAL_COMMUNITY): Payer: Self-pay | Admitting: *Deleted

## 2019-01-13 ENCOUNTER — Other Ambulatory Visit: Payer: Self-pay

## 2019-01-15 ENCOUNTER — Other Ambulatory Visit (HOSPITAL_COMMUNITY)
Admission: RE | Admit: 2019-01-15 | Discharge: 2019-01-15 | Disposition: A | Discharge status: Home / Self Care | Payer: BC Managed Care – PPO | Source: Ambulatory Visit | Attending: Internal Medicine | Admitting: Internal Medicine

## 2019-01-15 ENCOUNTER — Ambulatory Visit (AMBULATORY_SURGERY_CENTER): Payer: Self-pay

## 2019-01-15 ENCOUNTER — Other Ambulatory Visit: Payer: Self-pay

## 2019-01-15 VITALS — Temp 96.9°F | Ht 61.0 in | Wt 327.0 lb

## 2019-01-15 DIAGNOSIS — K5190 Ulcerative colitis, unspecified, without complications: Secondary | ICD-10-CM

## 2019-01-15 DIAGNOSIS — Z20828 Contact with and (suspected) exposure to other viral communicable diseases: Secondary | ICD-10-CM | POA: Diagnosis not present

## 2019-01-15 MED ORDER — NA SULFATE-K SULFATE-MG SULF 17.5-3.13-1.6 GM/177ML PO SOLN: 1.0000 | Freq: Once | ORAL | 0 refills | Status: AC

## 2019-01-15 NOTE — Progress Notes (Signed)
Denies allergies to eggs or soy products. Denies complication of anesthesia or sedation. Denies use of weight loss medication. Denies use of O2.   Emmi instructions given for colonoscopy.  A 15.00 coupon for Suprep was given to the patient.

## 2019-01-16 LAB — NOVEL CORONAVIRUS, NAA (HOSP ORDER, SEND-OUT TO REF LAB; TAT 18-24 HRS): SARS-CoV-2, NAA: NOT DETECTED

## 2019-01-19 ENCOUNTER — Encounter (HOSPITAL_COMMUNITY)
Admission: RE | Disposition: A | Discharge status: Home / Self Care | Payer: Self-pay | Source: Home / Self Care | Attending: Internal Medicine

## 2019-01-19 ENCOUNTER — Encounter (HOSPITAL_COMMUNITY): Payer: Self-pay | Admitting: *Deleted

## 2019-01-19 ENCOUNTER — Ambulatory Visit (HOSPITAL_COMMUNITY): Payer: BC Managed Care – PPO | Admitting: Anesthesiology

## 2019-01-19 ENCOUNTER — Ambulatory Visit (HOSPITAL_COMMUNITY)
Admission: RE | Admit: 2019-01-19 | Discharge: 2019-01-19 | Disposition: A | Discharge status: Home / Self Care | Payer: BC Managed Care – PPO | Attending: Internal Medicine | Admitting: Internal Medicine

## 2019-01-19 DIAGNOSIS — K5100 Ulcerative (chronic) pancolitis without complications: Secondary | ICD-10-CM

## 2019-01-19 DIAGNOSIS — Z820 Family history of epilepsy and other diseases of the nervous system: Secondary | ICD-10-CM | POA: Diagnosis not present

## 2019-01-19 DIAGNOSIS — Z882 Allergy status to sulfonamides status: Secondary | ICD-10-CM | POA: Diagnosis not present

## 2019-01-19 DIAGNOSIS — K5190 Ulcerative colitis, unspecified, without complications: Secondary | ICD-10-CM

## 2019-01-19 DIAGNOSIS — M26609 Unspecified temporomandibular joint disorder, unspecified side: Secondary | ICD-10-CM | POA: Insufficient documentation

## 2019-01-19 DIAGNOSIS — Z888 Allergy status to other drugs, medicaments and biological substances status: Secondary | ICD-10-CM | POA: Insufficient documentation

## 2019-01-19 DIAGNOSIS — Z800 Family history of malignant neoplasm of digestive organs: Secondary | ICD-10-CM | POA: Insufficient documentation

## 2019-01-19 DIAGNOSIS — Z8669 Personal history of other diseases of the nervous system and sense organs: Secondary | ICD-10-CM

## 2019-01-19 DIAGNOSIS — Z8719 Personal history of other diseases of the digestive system: Secondary | ICD-10-CM | POA: Insufficient documentation

## 2019-01-19 DIAGNOSIS — Z8615 Personal history of latent tuberculosis infection: Secondary | ICD-10-CM | POA: Insufficient documentation

## 2019-01-19 DIAGNOSIS — Z6841 Body Mass Index (BMI) 40.0 and over, adult: Secondary | ICD-10-CM | POA: Insufficient documentation

## 2019-01-19 DIAGNOSIS — K219 Gastro-esophageal reflux disease without esophagitis: Secondary | ICD-10-CM | POA: Diagnosis not present

## 2019-01-19 DIAGNOSIS — Z79899 Other long term (current) drug therapy: Secondary | ICD-10-CM

## 2019-01-19 DIAGNOSIS — Z8249 Family history of ischemic heart disease and other diseases of the circulatory system: Secondary | ICD-10-CM | POA: Insufficient documentation

## 2019-01-19 DIAGNOSIS — Z8619 Personal history of other infectious and parasitic diseases: Secondary | ICD-10-CM | POA: Diagnosis not present

## 2019-01-19 DIAGNOSIS — Z1211 Encounter for screening for malignant neoplasm of colon: Secondary | ICD-10-CM | POA: Insufficient documentation

## 2019-01-19 DIAGNOSIS — Z833 Family history of diabetes mellitus: Secondary | ICD-10-CM | POA: Insufficient documentation

## 2019-01-19 DIAGNOSIS — K648 Other hemorrhoids: Secondary | ICD-10-CM | POA: Diagnosis not present

## 2019-01-19 DIAGNOSIS — Z227 Latent tuberculosis: Secondary | ICD-10-CM

## 2019-01-19 DIAGNOSIS — I10 Essential (primary) hypertension: Secondary | ICD-10-CM | POA: Diagnosis not present

## 2019-01-19 DIAGNOSIS — E538 Deficiency of other specified B group vitamins: Secondary | ICD-10-CM | POA: Insufficient documentation

## 2019-01-19 HISTORY — PX: COLONOSCOPY WITH PROPOFOL: SHX5780

## 2019-01-19 HISTORY — DX: Respiratory tuberculosis unspecified: A15.9

## 2019-01-19 HISTORY — PX: BIOPSY: SHX5522

## 2019-01-19 SURGERY — COLONOSCOPY WITH PROPOFOL
Anesthesia: Monitor Anesthesia Care

## 2019-01-19 MED ORDER — PROPOFOL 10 MG/ML IV BOLUS
INTRAVENOUS | Status: DC | PRN
  Administered 2019-01-19 (×4): 20 mg via INTRAVENOUS

## 2019-01-19 MED ORDER — PROPOFOL 10 MG/ML IV BOLUS
INTRAVENOUS | Status: AC
  Filled 2019-01-19: qty 60

## 2019-01-19 MED ORDER — SODIUM CHLORIDE 0.9 % IV SOLN: INTRAVENOUS | Status: DC

## 2019-01-19 MED ORDER — LIDOCAINE 2% (20 MG/ML) 5 ML SYRINGE
INTRAMUSCULAR | Status: DC | PRN
  Administered 2019-01-19: 100 mg via INTRAVENOUS

## 2019-01-19 MED ORDER — LACTATED RINGERS IV SOLN
INTRAVENOUS | Status: DC
  Administered 2019-01-19: 08:00:00 1000 mL via INTRAVENOUS

## 2019-01-19 MED ORDER — PROPOFOL 500 MG/50ML IV EMUL
INTRAVENOUS | Status: DC | PRN
  Administered 2019-01-19: 100 ug/kg/min via INTRAVENOUS

## 2019-01-19 SURGICAL SUPPLY — 22 items

## 2019-01-19 NOTE — Anesthesia Procedure Notes (Signed)
Date/Time: 01/19/2019 9:42 AM Performed by: Sharlette Dense, CRNA Oxygen Delivery Method: Simple face mask

## 2019-01-19 NOTE — Discharge Instructions (Signed)
YOU HAD AN ENDOSCOPIC PROCEDURE TODAY: Refer to the procedure report and other information in the discharge instructions given to you for any specific questions about what was found during the examination. If this information does not answer your questions, please call [place] office at [phone] to clarify.  ° °YOU SHOULD EXPECT: Some feelings of bloating in the abdomen. Passage of more gas than usual. Walking can help get rid of the air that was put into your GI tract during the procedure and reduce the bloating. If you had a lower endoscopy (such as a colonoscopy or flexible sigmoidoscopy) you may notice spotting of blood in your stool or on the toilet paper. Some abdominal soreness may be present for a day or two, also. ° °DIET: Your first meal following the procedure should be a light meal and then it is ok to progress to your normal diet. A half-sandwich or bowl of soup is an example of a good first meal. Heavy or fried foods are harder to digest and may make you feel nauseous or bloated. Drink plenty of fluids but you should avoid alcoholic beverages for 24 hours. If you had a esophageal dilation, please see attached instructions for diet.   ° °ACTIVITY: Your care partner should take you home directly after the procedure. You should plan to take it easy, moving slowly for the rest of the day. You can resume normal activity the day after the procedure however YOU SHOULD NOT DRIVE, use power tools, machinery or perform tasks that involve climbing or major physical exertion for 24 hours (because of the sedation medicines used during the test).  ° °SYMPTOMS TO REPORT IMMEDIATELY: °A gastroenterologist can be reached at any hour. Please call [phone]  for any of the following symptoms:  °Following lower endoscopy (colonoscopy, flexible sigmoidoscopy) °Excessive amounts of blood in the stool  °Significant tenderness, worsening of abdominal pains  °Swelling of the abdomen that is new, acute  °Fever of 100° or  higher  °Following upper endoscopy (EGD, EUS, ERCP, esophageal dilation) °Vomiting of blood or coffee ground material  °New, significant abdominal pain  °New, significant chest pain or pain under the shoulder blades  °Painful or persistently difficult swallowing  °New shortness of breath  °Black, tarry-looking or red, bloody stools ° °FOLLOW UP:  °If any biopsies were taken you will be contacted by phone or by letter within the next 1-3 weeks. Call [phone]  if you have not heard about the biopsies in 3 weeks.  °Please also call with any specific questions about appointments or follow up tests. ° °

## 2019-01-19 NOTE — Op Note (Signed)
[facility] Patient Name: Vicki Burton Procedure Date: 01/19/2019 MRN: [id number] Attending MD: Jerene Bears , MD Date of Birth: 08-May-1965 CSN: [id number] Age: 53 Admit Type: Outpatient Procedure:                Colonoscopy Indications:              High risk colon cancer surveillance: Ulcerative                            pancolitis of 8 (or more) years duration, Last                            colonoscopy 3 years ago Providers:                Lajuan Lines. Hilarie Fredrickson, MD, Jeanella Cara, RN,                            Marguerita Merles, Technician, Lazaro Arms, Technician Referring MD:             Patricia Pesa. Wilson Medicines:                Monitored Anesthesia Care Complications:            No immediate complications. Estimated Blood Loss:     Estimated blood loss was minimal. Procedure:                Pre-Anesthesia Assessment:                           - Prior to the procedure, a History and Physical                            was performed, and patient medications and                            allergies were reviewed. The patient's tolerance of                            previous anesthesia was also reviewed. The risks                            and benefits of the procedure and the sedation                            options and risks were discussed with the patient.                            All questions were answered, and informed consent                            was obtained. Prior Anticoagulants: The patient has                            taken no previous anticoagulant or antiplatelet  agents. ASA Grade Assessment: III - A patient with                            severe systemic disease. After reviewing the risks                            and benefits, the patient was deemed in                            satisfactory condition to undergo the procedure.                           After obtaining informed consent, the colonoscope                       was passed under direct vision. Throughout the                            procedure, the patient's blood pressure, pulse, and                            oxygen saturations were monitored continuously. The                            CF-HQ190L ([id number]) Olympus colonoscope was                            introduced through the anus and advanced to the                            terminal ileum. The colonoscopy was performed                            without difficulty. The patient tolerated the                            procedure well. The quality of the bowel                            preparation was good. The terminal ileum, ileocecal                            valve, appendiceal orifice, and rectum were                            photographed. Scope In: 9:51:38 AM Scope Out: 10:09:37 AM Scope Withdrawal Time: 0 hours 12 minutes 42 seconds  Total Procedure Duration: 0 hours 17 minutes 59 seconds  Findings:      The digital rectal exam was normal.      The terminal ileum appeared normal.      Inflammation was not found based on the endoscopic appearance of the       mucosa in the colon. This was graded as Mayo Score 0 (normal or inactive       disease), and when compared to the previous examination,  the findings       are improved. Four biopsies were taken every 10 cm with a cold forceps       from the entire colon for ulcerative colitis surveillance. These biopsy       specimens from the right colon and left colon were sent to Pathology.      Internal hemorrhoids were found during retroflexion. The hemorrhoids       were small. Impression:               - The examined portion of the ileum was normal.                           - Inactive (Mayo Score 0) ulcerative colitis, in                            remission, improved since the last examination.                            Biopsied.                           - Small internal hemorrhoids. Moderate Sedation:       N/A Recommendation:           - Patient has a contact number available for                            emergencies. The signs and symptoms of potential                            delayed complications were discussed with the                            patient. Return to normal activities tomorrow.                            Written discharge instructions were provided to the                            patient.                           - Resume previous diet.                           - Continue present medications.                           - Await pathology results.                           - Repeat colonoscopy is recommended for                            surveillance. The colonoscopy date will be                            determined after pathology  results from today's                            exam become available for review. Procedure Code(s):        --- Professional ---                           [phone], Colonoscopy, flexible; with biopsy, single                            or multiple Diagnosis Code(s):        --- Professional ---                           K51.00, Ulcerative (chronic) pancolitis without                            complications                           K64.8, Other hemorrhoids CPT copyright 2019 American Medical Association. All rights reserved. The codes documented in this report are preliminary and upon coder review may  be revised to meet current compliance requirements. Jerene Bears, MD 01/19/2019 10:25:37 AM This report has been signed electronically. Number of Addenda: 0

## 2019-01-19 NOTE — Anesthesia Postprocedure Evaluation (Signed)
Anesthesia Post Note  Patient: Vicki Burton  Procedure(s) Performed: COLONOSCOPY WITH PROPOFOL (N/A ) BIOPSY     Patient location during evaluation: Endoscopy Anesthesia Type: MAC Level of consciousness: awake and alert Pain management: pain level controlled Vital Signs Assessment: post-procedure vital signs reviewed and stable Respiratory status: spontaneous breathing, nonlabored ventilation, respiratory function stable and patient connected to nasal cannula oxygen Cardiovascular status: blood pressure returned to baseline and stable Postop Assessment: no apparent nausea or vomiting Anesthetic complications: no    Last Vitals:  Vitals:   01/19/19 1020 01/19/19 1030  BP: (!) 146/77 (!) 151/76  Pulse: (!) 56 (!) 54  Resp: (!) 21 15  Temp:    SpO2: 97% 96%    Last Pain:  Vitals:   01/19/19 1020  TempSrc:   PainSc: 0-No pain                 Barnet Glasgow

## 2019-01-19 NOTE — Anesthesia Preprocedure Evaluation (Addendum)
Anesthesia Evaluation  Patient identified by MRN, date of birth, ID band Patient awake    Reviewed: Allergy & Precautions, NPO status , Patient's Chart, lab work & pertinent test results  Airway Mallampati: II  TM Distance: >3 FB Neck ROM: Full    Dental no notable dental hx. (+) Teeth Intact   Pulmonary neg pulmonary ROS,  S/p Covid 19 infection earlier this year.Test 10/1 neg  Hx of "latent TB"   Pulmonary exam normal breath sounds clear to auscultation       Cardiovascular hypertension, Normal cardiovascular exam Rhythm:Regular Rate:Normal     Neuro/Psych negative neurological ROS  negative psych ROS   GI/Hepatic Neg liver ROS, GERD  ,Hx of Ulcerative colitis   Endo/Other  negative endocrine ROS  Renal/GU negative Renal ROS     Musculoskeletal negative musculoskeletal ROS (+)   Abdominal (+) + obese,   Peds  Hematology   Anesthesia Other Findings   Reproductive/Obstetrics negative OB ROS                            Anesthesia Physical Anesthesia Plan  ASA: III  Anesthesia Plan: MAC   Post-op Pain Management:    Induction:   PONV Risk Score and Plan: Treatment may vary due to age or medical condition  Airway Management Planned: Natural Airway and Nasal Cannula  Additional Equipment:   Intra-op Plan:   Post-operative Plan:   Informed Consent: I have reviewed the patients History and Physical, chart, labs and discussed the procedure including the risks, benefits and alternatives for the proposed anesthesia with the patient or authorized representative who has indicated his/her understanding and acceptance.     Dental advisory given  Plan Discussed with: CRNA  Anesthesia Plan Comments: (Colonoscopy for UC)      Anesthesia Quick Evaluation

## 2019-01-19 NOTE — H&P (Signed)
HPI: 53 year old female with a past medical history of ulcerative colitis diagnosed in 2001, history of uveitis, B12 deficiency, morbid obesity, latent tuberculosis treated with isoniazid who presents for surveillance colonoscopy.  Last seen in clinic on 12/09/2018 by virtual visit.  No new complaints today.  Tolerated the prep.  Past Medical History:  Diagnosis Date  . Allergy   . COVID-19   . Family history of malignant neoplasm of gastrointestinal tract    maternal great aunt  . Iron deficiency anemia, unspecified   . Obesity, unspecified   . Other and unspecified noninfectious gastroenteritis and colitis(558.9)   . Ovarian cyst   . TMJ (dislocation of temporomandibular joint)   . Tuberculosis    latent TB, not active. taking Nydrazid.  Marland Kitchen Ulcerative colitis, unspecified   . Unspecified essential hypertension   . Unspecified iridocyclitis   . Vitamin B12 deficiency     Past Surgical History:  Procedure Laterality Date  . ACHILLES TENDON REPAIR Left   . BARTHOLIN GLAND CYST EXCISION  1992  . COLONOSCOPY    . COLONOSCOPY N/A 02/19/2014   Procedure: COLONOSCOPY;  Surgeon: Jerene Bears, MD;  Location: [facility];  Service: Gastroenterology;  Laterality: N/A;  . COLONOSCOPY WITH PROPOFOL N/A 01/12/2016   Procedure: COLONOSCOPY WITH PROPOFOL;  Surgeon: Jerene Bears, MD;  Location: [facility];  Service: Gastroenterology;  Laterality: N/A;  . cyst removed from back  07/2015    (Not in an outpatient encounter)   Allergies  Allergen Reactions  . Lisinopril Swelling and Cough    Swelling of the mouth and throat  . Amlodipine Swelling    Swelling of the mouth and throat  . Sulfonamide Derivatives Hives    Family History  Problem Relation Age of Onset  . Diabetes Father   . Heart disease Father   . Colon cancer Other        maternal great Aunt  . Dementia Mother   . Colon cancer Maternal Aunt   . Rectal cancer Neg Hx   . Stomach cancer Neg Hx   . Esophageal cancer Neg  Hx     Social History   Tobacco Use  . Smoking status: Never Smoker  . Smokeless tobacco: Never Used  Substance Use Topics  . Alcohol use: No  . Drug use: No    ROS: As per history of present illness, otherwise negative  Pulse 72   Temp (!) 97.5 F (36.4 C) (Oral)   Resp 20   Ht 5' 1"  (1.549 m)   Wt (!) 148.3 kg   LMP 04/29/2018   SpO2 100%   BMI 61.78 kg/m  Gen: awake, alert, NAD HEENT: anicteric, op clear CV: RRR, no mrg Pulm: CTA b/l Abd: soft, obese NT/ND, +BS throughout Ext: no c/c/e Neuro: nonfocal   RELEVANT LABS AND IMAGING: CBC    Component Value Date/Time   WBC 5.8 05/22/2018 1411   RBC 4.44 05/22/2018 1411   HGB 13.7 05/22/2018 1411   HCT 40.6 05/22/2018 1411   PLT 321 05/22/2018 1411   MCV 91.4 05/22/2018 1411   MCH 30.9 05/22/2018 1411   MCHC 33.7 05/22/2018 1411   RDW 13.3 05/22/2018 1411   LYMPHSABS 2.3 05/06/2018 0900   MONOABS 0.5 05/06/2018 0900   EOSABS 0.2 05/06/2018 0900   BASOSABS 0.1 05/06/2018 0900    CMP     Component Value Date/Time   NA 139 06/10/2018 1621   K 4.2 06/10/2018 1621   CL 102 06/10/2018 1621   CO2  27 06/10/2018 1621   GLUCOSE 94 06/10/2018 1621   BUN 20 06/10/2018 1621   CREATININE 1.21 (H) 06/10/2018 1621   CALCIUM 9.4 06/10/2018 1621   PROT 7.4 06/10/2018 1621   ALBUMIN 4.2 05/06/2018 0900   AST 32 06/10/2018 1621   ALT 31 (H) 06/10/2018 1621   ALKPHOS 53 05/06/2018 0900   BILITOT 0.4 06/10/2018 1621   GFRNONAA 51 (L) 06/10/2018 1621   GFRAA 59 (L) 06/10/2018 1621    ASSESSMENT/PLAN: 53 year old female with a past medical history of ulcerative colitis diagnosed in 2001, history of uveitis, B12 deficiency, morbid obesity, latent tuberculosis treated with isoniazid who presents for surveillance colonoscopy.  1.  Pan ulcerative colitis diagnosed 2001 --for surveillance colonoscopy. The nature of the procedure, as well as the risks, benefits, and alternatives were carefully and thoroughly reviewed with  the patient. Ample time for discussion and questions allowed. The patient understood, was satisfied, and agreed to proceed.

## 2019-01-19 NOTE — Transfer of Care (Signed)
Immediate Anesthesia Transfer of Care Note  Patient: Vicki Burton  Procedure(s) Performed: COLONOSCOPY WITH PROPOFOL (N/A ) BIOPSY  Patient Location: Endoscopy Unit  Anesthesia Type:MAC  Level of Consciousness: awake and alert   Airway & Oxygen Therapy: Patient Spontanous Breathing and Patient connected to face mask oxygen  Post-op Assessment: Report given to RN and Post -op Vital signs reviewed and stable  Post vital signs: Reviewed and stable  Last Vitals:  Vitals Value Taken Time  BP    Temp    Pulse 59 01/19/19 1011  Resp 10 01/19/19 1011  SpO2 100 % 01/19/19 1011  Vitals shown include unvalidated device data.  Last Pain:  Vitals:   01/19/19 0823  TempSrc: Oral  PainSc: 0-No pain         Complications: No apparent anesthesia complications

## 2019-01-20 ENCOUNTER — Encounter (HOSPITAL_COMMUNITY): Payer: Self-pay | Admitting: Internal Medicine

## 2019-01-20 LAB — SURGICAL PATHOLOGY

## 2019-01-23 ENCOUNTER — Encounter: Payer: Self-pay | Admitting: Internal Medicine

## 2019-01-26 ENCOUNTER — Ambulatory Visit: Payer: BC Managed Care – PPO | Admitting: Internal Medicine

## 2019-02-04 ENCOUNTER — Encounter: Payer: Self-pay | Admitting: Internal Medicine

## 2019-02-04 ENCOUNTER — Other Ambulatory Visit: Payer: Self-pay

## 2019-02-04 ENCOUNTER — Ambulatory Visit: Payer: BC Managed Care – PPO | Admitting: Internal Medicine

## 2019-02-04 VITALS — BP 146/74 | HR 62 | Temp 98.2°F | Wt 328.0 lb

## 2019-02-04 DIAGNOSIS — K51919 Ulcerative colitis, unspecified with unspecified complications: Secondary | ICD-10-CM | POA: Diagnosis not present

## 2019-02-04 DIAGNOSIS — Z227 Latent tuberculosis: Secondary | ICD-10-CM | POA: Diagnosis not present

## 2019-02-04 DIAGNOSIS — Z23 Encounter for immunization: Secondary | ICD-10-CM

## 2019-02-04 NOTE — Assessment & Plan Note (Signed)
Completing 9 months of treatment.  Quantiferon likely will remain positive so no indication to recheck.  Monitoring going forward will only be symptom based.   rtc prn

## 2019-02-04 NOTE — Progress Notes (Signed)
   Subjective:    Patient ID: Vicki Burton, female    DOB: Jan 12, 1966, 53 y.o.   MRN: [id number]  HPI Here for follow up of latent Tb Followed by Dr. Hilarie Fredrickson for ulcerative colitis and on Humira.  Had tested positive with Quantiferon gold and I started her on treatment for latent Tb.  Now completing 9 months of INH + B6.  No sob, no cough, no congestion, no night sweats.     Review of Systems  Constitutional: Negative for chills, fever and unexpected weight change.  Respiratory: Negative for cough and shortness of breath.   Skin: Negative for rash.       Objective:   Physical Exam Constitutional:      Appearance: Normal appearance.  Eyes:     General: No scleral icterus. Skin:    Findings: No rash.  Neurological:     General: No focal deficit present.     Mental Status: She is alert.  Psychiatric:        Mood and Affect: Mood normal.           Assessment & Plan:

## 2019-02-04 NOTE — Assessment & Plan Note (Signed)
Back on Humira and tolerating.  Uveitis improved as well.

## 2019-02-05 LAB — COMPLETE METABOLIC PANEL WITH GFR
AG Ratio: 1.2 (calc) (ref 1.0–2.5)
ALT: 27 U/L (ref 6–29)
AST: 33 U/L (ref 10–35)
Albumin: 4.1 g/dL (ref 3.6–5.1)
Alkaline phosphatase (APISO): 47 U/L (ref 37–153)
BUN: 16 mg/dL (ref 7–25)
CO2: 29 mmol/L (ref 20–32)
Calcium: 9.6 mg/dL (ref 8.6–10.4)
Chloride: 103 mmol/L (ref 98–110)
Creat: 0.91 mg/dL (ref 0.50–1.05)
GFR, Est African American: 82 mL/min/{1.73_m2} (ref >=60)
GFR, Est Non African American: 71 mL/min/{1.73_m2} (ref >=60)
Globulin: 3.3 g/dL (calc) (ref 1.9–3.7)
Glucose, Bld: 98 mg/dL (ref 65–99)
Potassium: 3.8 mmol/L (ref 3.5–5.3)
Sodium: 140 mmol/L (ref 135–146)
Total Bilirubin: 0.4 mg/dL (ref 0.2–1.2)
Total Protein: 7.4 g/dL (ref 6.1–8.1)

## 2019-02-09 ENCOUNTER — Ambulatory Visit: Payer: BC Managed Care – PPO | Admitting: Internal Medicine

## 2019-02-16 ENCOUNTER — Other Ambulatory Visit: Payer: Self-pay | Admitting: Internal Medicine

## 2019-02-16 DIAGNOSIS — Z227 Latent tuberculosis: Secondary | ICD-10-CM

## 2019-02-24 ENCOUNTER — Telehealth: Payer: Self-pay | Admitting: Internal Medicine

## 2019-02-24 NOTE — Telephone Encounter (Signed)
Let pt know the report has been sent to her PCP.

## 2019-03-10 ENCOUNTER — Encounter: Payer: Self-pay | Admitting: Internal Medicine

## 2019-03-20 ENCOUNTER — Telehealth: Payer: Self-pay | Admitting: Internal Medicine

## 2019-03-20 NOTE — Telephone Encounter (Signed)
Pt requested a call back to discuss an excuse of absence for work.

## 2019-03-20 NOTE — Telephone Encounter (Signed)
Pt states she has not received letter for work yet. Mailed letter again and also sent via mychart. Pt aware,

## 2019-04-01 ENCOUNTER — Other Ambulatory Visit: Payer: Self-pay | Admitting: Internal Medicine

## 2019-05-29 ENCOUNTER — Other Ambulatory Visit: Payer: Self-pay

## 2019-05-29 ENCOUNTER — Telehealth: Payer: Self-pay | Admitting: Internal Medicine

## 2019-05-29 MED ORDER — HUMIRA (2 PEN) 40 MG/0.4ML ~~LOC~~ AJKT: 40.0000 mg | AUTO-INJECTOR | SUBCUTANEOUS | 6 refills | Status: DC

## 2019-05-29 NOTE — Telephone Encounter (Signed)
Prescription sent to pharmacy.

## 2019-07-03 ENCOUNTER — Other Ambulatory Visit: Payer: Self-pay

## 2019-12-23 ENCOUNTER — Other Ambulatory Visit: Payer: Self-pay | Admitting: Internal Medicine

## 2020-01-11 ENCOUNTER — Telehealth: Payer: Self-pay | Admitting: Internal Medicine

## 2020-01-11 NOTE — Telephone Encounter (Signed)
Patient is calling in reference to a form for medication approval

## 2020-01-12 NOTE — Telephone Encounter (Signed)
Paperwork faxed this am for PA.

## 2020-08-09 ENCOUNTER — Other Ambulatory Visit: Payer: Self-pay | Admitting: Internal Medicine

## 2020-11-03 ENCOUNTER — Telehealth: Payer: Self-pay | Admitting: Internal Medicine

## 2020-11-03 NOTE — Telephone Encounter (Signed)
I recommend she postpone the Humira shot until the following Sunday i.e. delayed by a week as long as she is feeling better at that point   I also recommend that she schedule a follow-up visit with Dr. Hilarie Fredrickson, has not been seen in over a year

## 2020-11-03 NOTE — Telephone Encounter (Signed)
Pyrtle pt with h/o UC calling. States she tested positive for covid on Monday, she is still congested and rean a temp until yesterday. She is due for her next Humira injection on Sunday and wants to know if she needs to proceed with that or postpone the injection. As DOD please advise.

## 2020-11-03 NOTE — Telephone Encounter (Signed)
Inbound call from patient requesting a call from a nurse please in regards to her Humira.

## 2020-11-04 NOTE — Telephone Encounter (Signed)
Spoke with pt and she is aware. Pt states she will call back later today to schedule OV.

## 2020-11-15 IMAGING — DX DG CHEST 2V
2 series · 2 of 2 positions shown · non-contrast
Comparison: 06/19/2017.

CLINICAL DATA: Positive QuantiFERON gold test.

EXAM:
CHEST - 2 VIEW

[dg chest 2 view (1 of 2)]
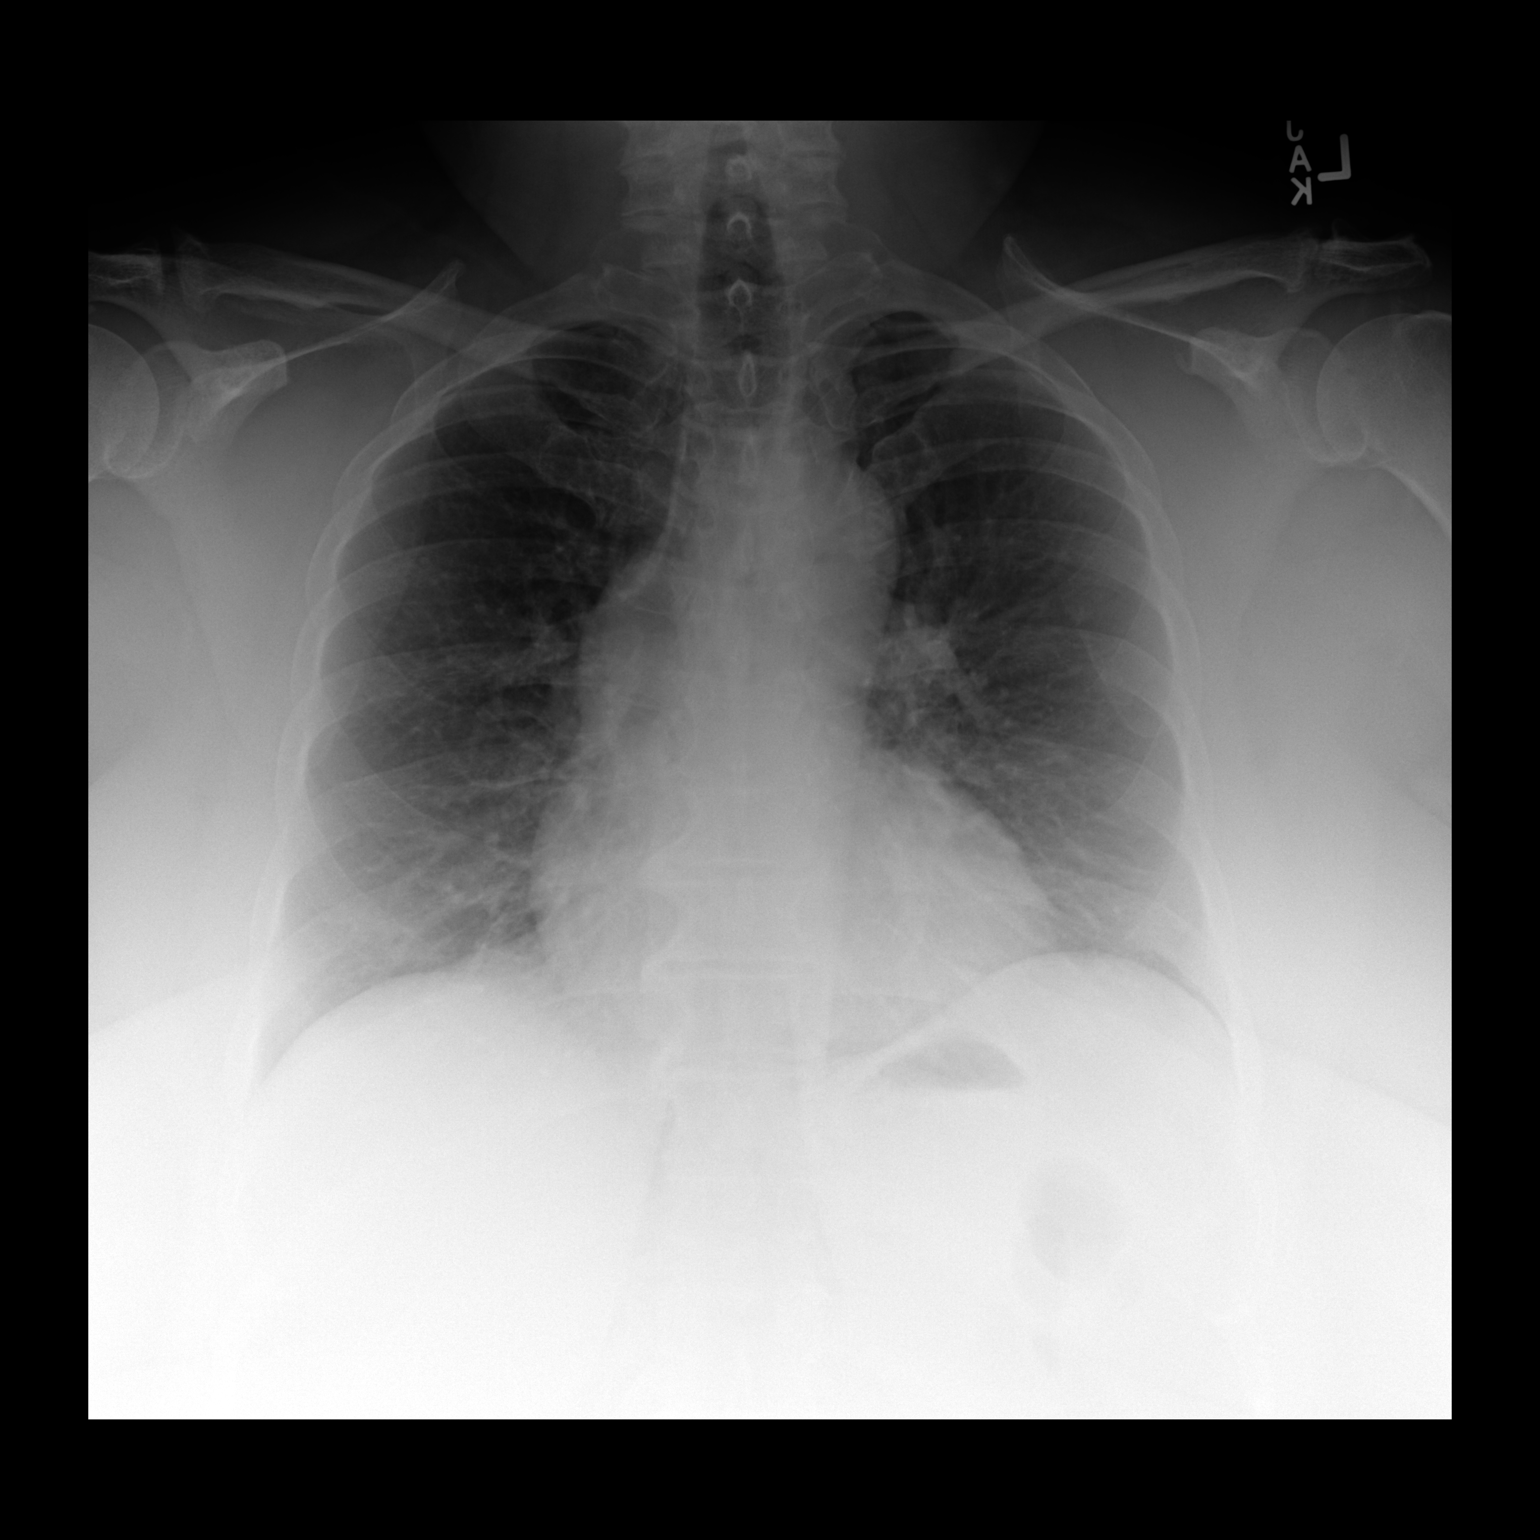

[dg chest 2 view (2 of 2)]
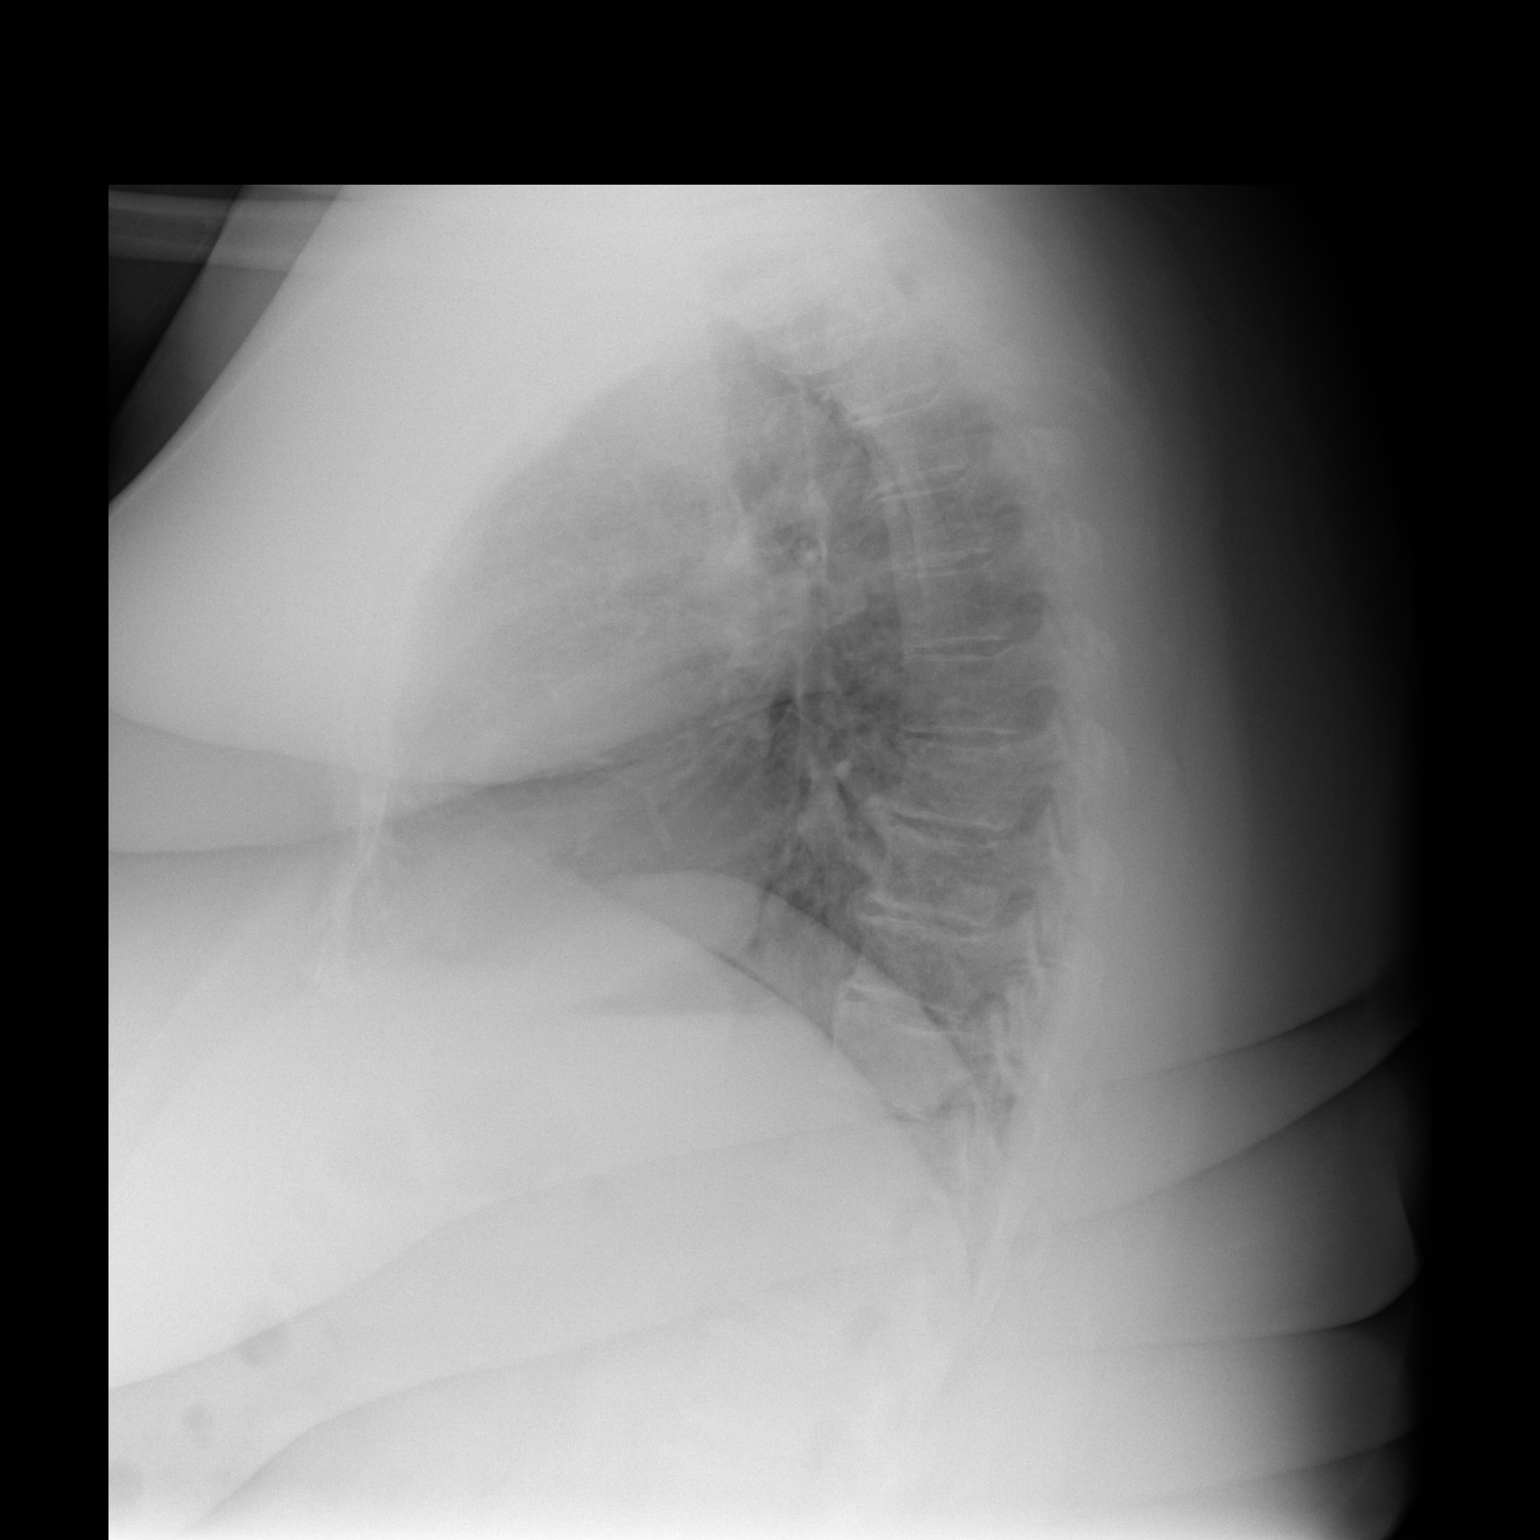

[2 of 2 positions shown; findings below may reference images not displayed]

FINDINGS: Mediastinum and hilar structures normal. Mild cardiomegaly, no
pulmonary venous congestion. Mild bibasilar atelectasis. No evidence
of granulomas disease. No pleural effusion or pneumothorax.
Degenerative change thoracic spine.
IMPRESSION: 1.  Mild bibasilar atelectasis.  No evidence of granulomas disease.

2.  Mild cardiomegaly.  No pulmonary venous congestion.

## 2020-12-27 ENCOUNTER — Encounter: Payer: Self-pay | Admitting: Internal Medicine

## 2020-12-27 ENCOUNTER — Ambulatory Visit: Payer: BC Managed Care – PPO | Admitting: Internal Medicine

## 2020-12-27 ENCOUNTER — Ambulatory Visit: Payer: BC Managed Care – PPO | Admitting: Physician Assistant

## 2020-12-27 VITALS — BP 140/70 | HR 76 | Ht 60.0 in | Wt 312.0 lb

## 2020-12-27 DIAGNOSIS — K529 Noninfective gastroenteritis and colitis, unspecified: Secondary | ICD-10-CM

## 2020-12-27 DIAGNOSIS — K5100 Ulcerative (chronic) pancolitis without complications: Secondary | ICD-10-CM

## 2020-12-27 DIAGNOSIS — Z8669 Personal history of other diseases of the nervous system and sense organs: Secondary | ICD-10-CM

## 2020-12-27 DIAGNOSIS — E538 Deficiency of other specified B group vitamins: Secondary | ICD-10-CM

## 2020-12-27 DIAGNOSIS — Z8615 Personal history of latent tuberculosis infection: Secondary | ICD-10-CM | POA: Diagnosis not present

## 2020-12-27 DIAGNOSIS — K51919 Ulcerative colitis, unspecified with unspecified complications: Secondary | ICD-10-CM

## 2020-12-27 MED ORDER — HUMIRA (2 PEN) 40 MG/0.4ML ~~LOC~~ AJKT: AUTO-INJECTOR | SUBCUTANEOUS | 5 refills | Status: DC

## 2020-12-27 NOTE — Patient Instructions (Signed)
We have sent the following medications to your pharmacy for you to pick up at your convenience: Humira   If you are age 57 or younger, your body mass index should be between 19-25. Your Body mass index is 60.93 kg/m. If this is out of the aformentioned range listed, please consider follow up with your Primary Care Provider.   __________________________________________________________  The [facility] providers would like to encourage you to use [facility] to communicate with providers for non-urgent requests or questions.  Due to long hold times on the telephone, sending your provider a message by [facility] may be a faster and more efficient way to get a response.  Please allow 48 business hours for a response.  Please remember that this is for non-urgent requests.   You will be due for a recall( hospital )colonoscopy in 01/2022 . We will send you a reminder in the mail when it gets closer to that time.  Thank you for choosing me and [facility].  Dr.Pyrtle

## 2020-12-28 ENCOUNTER — Encounter: Payer: Self-pay | Admitting: Internal Medicine

## 2020-12-28 NOTE — Progress Notes (Signed)
   Subjective:    Patient ID: Vicki Burton, female    DOB: Dec 05, 1965, 55 y.o.   MRN: [id number]  HPI Vicki Burton is a 55 year old female with a history of ulcerative colitis diagnosed in 2001 (hepatic flexure to rectum, history of uveitis, B12 deficiency, morbid obesity, latent tuberculosis having completed isoniazid therapy x9 months, who is here for follow-up.  She is here alone today.  She reports she is doing very well.  She continues Humira every 14 days.  She contracted COVID-19 for the second time and took 1 week off of her Humira but resumed it without problem.  The COVID-19 infection was not severe.  She reports her bowel habits is regular.  She does use MiraLAX from time to time for mild constipation.  No blood in stool or melena.  She did have 1 day of red blood on the tissue which she attributes to a hemorrhoid.  She is not having abdominal pain.  No recent ocular complaints.  She changed to Ozempic about 2 months ago from metformin.  Metformin had caused diarrhea.  She has had about a 40 pound weight loss this year.  This is intentional and she is pleased with the weight loss and hopes it will continue.  Her last colonoscopy was just under 2 years ago at [facility].  It shows that her colitis was in remission, chronic without dysplasia in the left colon and benign mucosa in the right colon.  Review of Systems As per HPI, otherwise negative  Current Medications, Allergies, Past Medical History, Past Surgical History, Family History and Social History were reviewed in [employer] record.    Objective:   Physical Exam BP 140/70 (BP Location: Left Wrist, Patient Position: Sitting, Cuff Size: Normal)   Pulse 76   Ht 5' (1.524 m) Comment: pt stated  Wt (!) 312 lb (141.5 kg)   LMP 04/29/2018   BMI 60.93 kg/m  Gen: awake, alert, NAD HEENT: anicteric,  CV: RRR, no mrg Pulm: CTA b/l Abd: soft, NT/ND, +BS throughout Ext: no c/c/e Neuro: nonfocal   Labs  reviewed by care everywhere     Assessment & Plan:  55 year old female with a history of ulcerative colitis diagnosed in 2001 (hepatic flexure to rectum, history of uveitis, B12 deficiency, morbid obesity, latent tuberculosis having completed isoniazid therapy x9 months, who is here for follow-up.    Ulcerative colitis, hepatic flexure to rectum, diagnosis 2001 --she is doing well and has maintained clinical remission on Humira.  Humira is also treating her uveitis.  We discussed surveillance but given clinical and histologic remission we will delay this 1 additional year.  We discussed this together. --Continue Humira 40 mg every 14 days --Surveillance colonoscopy recommended October 2023 --QuantiFERON gold is deferred given history of treated latent tuberculosis  2.  Uveitis --resolved on Humira.  She follows with [facility] [facility]  3.  Latent TB --successfully completed 9 months of isoniazid therapy under direction of infectious disease.  4.  B12 deficiency --continue daily oral B12  Annual follow-up, sooner if needed  20 minutes total spent today including patient facing time, coordination of care, reviewing medical history/procedures/pertinent radiology studies, and documentation of the encounter.

## 2021-01-20 ENCOUNTER — Ambulatory Visit: Payer: BC Managed Care – PPO | Admitting: Gastroenterology

## 2021-06-06 ENCOUNTER — Other Ambulatory Visit: Payer: Self-pay | Admitting: Internal Medicine

## 2021-11-08 ENCOUNTER — Other Ambulatory Visit: Payer: Self-pay | Admitting: Internal Medicine

## 2021-11-08 DIAGNOSIS — K5100 Ulcerative (chronic) pancolitis without complications: Secondary | ICD-10-CM

## 2021-11-10 ENCOUNTER — Other Ambulatory Visit (INDEPENDENT_AMBULATORY_CARE_PROVIDER_SITE_OTHER): Payer: BC Managed Care – PPO

## 2021-11-10 DIAGNOSIS — K5100 Ulcerative (chronic) pancolitis without complications: Secondary | ICD-10-CM

## 2021-11-10 LAB — CBC WITH DIFFERENTIAL/PLATELET
Basophils Absolute: 0 10*3/uL (ref 0.0–0.1)
Basophils Relative: 0.9 % (ref 0.0–3.0)
Eosinophils Absolute: 0.2 10*3/uL (ref 0.0–0.7)
Eosinophils Relative: 3.5 % (ref 0.0–5.0)
HCT: 38.8 % (ref 36.0–46.0)
Hemoglobin: 12.8 g/dL (ref 12.0–15.0)
Lymphocytes Relative: 54.5 % — ABNORMAL HIGH (ref 12.0–46.0)
Lymphs Abs: 2.7 10*3/uL (ref 0.7–4.0)
MCHC: 33 g/dL (ref 30.0–36.0)
MCV: 92.8 fl (ref 78.0–100.0)
Monocytes Absolute: 0.5 10*3/uL (ref 0.1–1.0)
Monocytes Relative: 9.2 % (ref 3.0–12.0)
Neutro Abs: 1.6 10*3/uL (ref 1.4–7.7)
Neutrophils Relative %: 31.9 % — ABNORMAL LOW (ref 43.0–77.0)
Platelets: 269 10*3/uL (ref 150.0–400.0)
RBC: 4.19 Mil/uL (ref 3.87–5.11)
RDW: 14.4 % (ref 11.5–15.5)
WBC: 4.9 10*3/uL (ref 4.0–10.5)

## 2021-11-10 LAB — COMPREHENSIVE METABOLIC PANEL
ALT: 25 U/L (ref 0–35)
AST: 20 U/L (ref 0–37)
Albumin: 4.1 g/dL (ref 3.5–5.2)
Alkaline Phosphatase: 56 U/L (ref 39–117)
BUN: 14 mg/dL (ref 6–23)
CO2: 28 mEq/L (ref 19–32)
Calcium: 9.3 mg/dL (ref 8.4–10.5)
Chloride: 107 mEq/L (ref 96–112)
Creatinine, Ser: 1.06 mg/dL (ref 0.40–1.20)
GFR: 57.99 mL/min — ABNORMAL LOW (ref >60.00)
Glucose, Bld: 123 mg/dL — ABNORMAL HIGH (ref 70–99)
Potassium: 3.8 mEq/L (ref 3.5–5.1)
Sodium: 142 mEq/L (ref 135–145)
Total Bilirubin: 0.4 mg/dL (ref 0.2–1.2)
Total Protein: 7.6 g/dL (ref 6.0–8.3)

## 2021-11-10 NOTE — Telephone Encounter (Signed)
CBC, CMP, QuantiFERON gold

## 2021-11-10 NOTE — Telephone Encounter (Signed)
Dr. Hilarie Fredrickson, patient is due for routine visit. Would you like any labs drawn as well? Looks like last Quant gold is from 2020. Please advise, thanks

## 2021-11-10 NOTE — Telephone Encounter (Signed)
Called and spoke with patient. She has been advised  that she is due for routine office visit and labs for Dr. Hilarie Fredrickson. Pt has been scheduled for the next available appt with Dr. Hilarie Fredrickson on Friday, 01/19/22 at 9:10 am. Pt is aware that she will need to stop by the lab in the basement of our office building prior to that appt. Pt is aware that we have sent in a refill of her Humira. Pt verbalized understanding and had no concerns at the end of the call.  Lab orders in epic. Humira refill sent to specialty pharmacy.

## 2021-11-15 LAB — QUANTIFERON-TB GOLD PLUS
Mitogen-NIL: >10 IU/mL
NIL: 0.05 IU/mL
QuantiFERON-TB Gold Plus: NEGATIVE
TB1-NIL: 0.01 IU/mL
TB2-NIL: 0 IU/mL

## 2021-11-16 ENCOUNTER — Encounter: Payer: Self-pay | Admitting: Internal Medicine

## 2022-01-19 ENCOUNTER — Ambulatory Visit (INDEPENDENT_AMBULATORY_CARE_PROVIDER_SITE_OTHER): Payer: BC Managed Care – PPO | Admitting: Internal Medicine

## 2022-01-19 ENCOUNTER — Encounter: Payer: Self-pay | Admitting: Internal Medicine

## 2022-01-19 VITALS — BP 140/80 | HR 62 | Ht 62.0 in | Wt 307.0 lb

## 2022-01-19 DIAGNOSIS — Z8669 Personal history of other diseases of the nervous system and sense organs: Secondary | ICD-10-CM | POA: Diagnosis not present

## 2022-01-19 DIAGNOSIS — K5100 Ulcerative (chronic) pancolitis without complications: Secondary | ICD-10-CM | POA: Diagnosis not present

## 2022-01-19 DIAGNOSIS — E538 Deficiency of other specified B group vitamins: Secondary | ICD-10-CM | POA: Diagnosis not present

## 2022-01-19 DIAGNOSIS — Z8615 Personal history of latent tuberculosis infection: Secondary | ICD-10-CM | POA: Diagnosis not present

## 2022-01-19 NOTE — Progress Notes (Signed)
Subjective:    Patient ID: Vicki Burton, female    DOB: [birth year], 56 y.o.   MRN: [id number]  HPI Vicki Burton is a 56 year old female with a history of ulcerative colitis diagnosed in 2001 (hepatic flexure to rectum, history of uveitis, B12 deficiency, morbid obesity, latent tuberculosis having completed isoniazid therapy x9 months, who is here for follow-up.  She is here alone today.  She reports that she has been feeling well.  She has no active colitis symptoms.  She has some mild constipation which she attributes to possibly Ozempic.  She is using MiraLAX when needed.  She is continue Humira 40 mg every 2 weeks though due to pharmacy area missed her last dose which was due 5 days ago.  She did get the medication yesterday.  She has intermittent abdominal gas but no diarrhea, blood in stool or abdominal pain.  No mucus in stool.  Her uveitis has been under control and she recently saw ophthalmology.  She is continue oral B12.   Review of Systems As per HPI, otherwise negative  Current Medications, Allergies, Past Medical History, Past Surgical History, Family History and Social History were reviewed in [employer] record.      Objective:   Physical Exam BP (!) 140/80   Pulse 62   Ht 5' 2"  (1.575 m)   Wt (!) 307 lb (139.3 kg)   LMP 04/16/2018   BMI 56.15 kg/m  Gen: awake, alert, NAD HEENT: anicteric Abd: soft, obese, NT/ND, +BS throughout Ext: no c/c/e Neuro: nonfocal     Latest Ref Rng & Units 11/10/2021    1:20 PM 05/22/2018    2:11 PM 05/06/2018    9:00 AM  CBC  WBC 4.0 - 10.5 K/uL 4.9  5.8  5.4   Hemoglobin 12.0 - 15.0 g/dL 12.8  13.7  14.0   Hematocrit 36.0 - 46.0 % 38.8  40.6  42.3   Platelets 150.0 - 400.0 K/uL 269.0  321  317.0    CMP     Component Value Date/Time   NA 142 11/10/2021 1320   K 3.8 11/10/2021 1320   CL 107 11/10/2021 1320   CO2 28 11/10/2021 1320   GLUCOSE 123 (H) 11/10/2021 1320   BUN 14 11/10/2021 1320   CREATININE  1.06 11/10/2021 1320   CREATININE 0.91 02/04/2019 1527   CALCIUM 9.3 11/10/2021 1320   PROT 7.6 11/10/2021 1320   ALBUMIN 4.1 11/10/2021 1320   AST 20 11/10/2021 1320   ALT 25 11/10/2021 1320   ALKPHOS 56 11/10/2021 1320   BILITOT 0.4 11/10/2021 1320   GFRNONAA 71 02/04/2019 1527   GFRAA 82 02/04/2019 1527         Assessment & Plan:  56 year old female with a history of ulcerative colitis diagnosed in 2001 (hepatic flexure to rectum, history of uveitis, B12 deficiency, morbid obesity, latent tuberculosis having completed isoniazid therapy x9 months, who is here for follow-up.   Ulcerative colitis, hepatic flexure to rectum, diagnosis 2001 --she is doing well and has maintained clinical remission on Humira.  We discussed surveillance colonoscopy which is due around this time.  She prefers to wait until the first part of the year due to her new insurance deductible. --Continue Humira 40 mg every 14 days, okay to take the dose today and then go back to her usual Sunday dosing --Surveillance colonoscopy in January or February in the outpatient hospital setting due to BMI greater than 50 --QuantiFERON gold is deferred given her history  of treated latent tuberculosis --Annual flu shot recommended  2.  Uveitis --resolved on Humira, she continues to follow with [facility] and was seen recently  3.  History of latent TB --successfully completed 9 months of isoniazid therapy under ID direction  4.  B12 deficiency --she continues on oral daily B12  Annual follow-up though first part of the year in the outpatient hospital setting for colonoscopy  30 minutes total spent today including patient facing time, coordination of care, reviewing medical history/procedures/pertinent radiology studies, and documentation of the encounter.

## 2022-01-19 NOTE — Patient Instructions (Addendum)
Continue Humira.  Continue B12.  Continue Miralax 17 grams dissolved in at least 8 ounces water/juice daily.  You will be due for colonoscopy in January/February 2024 at the hospital. We will be in contact with you when it gets closer to that time. _______________________________________________________  If you are age 58 or older, your body mass index should be between 23-30. Your Body mass index is 56.15 kg/m. If this is out of the aforementioned range listed, please consider follow up with your Primary Care Provider.  If you are age 12 or younger, your body mass index should be between 19-25. Your Body mass index is 56.15 kg/m. If this is out of the aformentioned range listed, please consider follow up with your Primary Care Provider.   ________________________________________________________  The [facility] providers would like to encourage you to use [facility] to communicate with providers for non-urgent requests or questions.  Due to long hold times on the telephone, sending your provider a message by [facility] may be a faster and more efficient way to get a response.  Please allow 48 business hours for a response.  Please remember that this is for non-urgent requests.  _______________________________________________________  Due to recent changes in healthcare laws, you may see the results of your imaging and laboratory studies on MyChart before your provider has had a chance to review them.  We understand that in some cases there may be results that are confusing or concerning to you. Not all laboratory results come back in the same time frame and the provider may be waiting for multiple results in order to interpret others.  Please give [place] 48 hours in order for your provider to thoroughly review all the results before contacting the office for clarification of your results.

## 2022-02-05 ENCOUNTER — Telehealth: Payer: Self-pay

## 2022-02-05 NOTE — Telephone Encounter (Signed)
Received fax from [facility] that pts Humira was denied.

## 2022-02-06 ENCOUNTER — Other Ambulatory Visit (HOSPITAL_COMMUNITY): Payer: Self-pay

## 2022-02-06 ENCOUNTER — Telehealth: Payer: Self-pay

## 2022-02-06 NOTE — Telephone Encounter (Signed)
Patient Advocate Encounter   Received notification from [employer] that prior authorization for Humira 24m/.4ml pen is required.   PA submitted on 02/06/2022 Key [id number] Status is pending      AJoneen Boers CWilliamsburgPatient Advocate Specialist CColmar ManorPatient Advocate Team Direct Number: [phone]: [phone]

## 2022-02-07 ENCOUNTER — Other Ambulatory Visit (HOSPITAL_COMMUNITY): Payer: Self-pay

## 2022-02-07 NOTE — Telephone Encounter (Signed)
Patient notified via mychart

## 2022-02-07 NOTE — Telephone Encounter (Signed)
Pharmacy Patient Advocate Encounter  Prior Authorization for Humira [id number] has been approved.    PA# [id number] dates: 02/07/2022 through 02/08/2023   AyJoneen BoersCPCumberlandatient Advocate Specialist CoHillsboroatient Advocate Team Direct Number: [phone]: [phone]

## 2022-03-19 ENCOUNTER — Telehealth: Payer: Self-pay | Admitting: Internal Medicine

## 2022-03-19 NOTE — Telephone Encounter (Signed)
Inbound call from patient requesting a call back from nurse. Patient stated she has been sick for the last 3 weeks and still coughing and is wanting to know if she should take her Humira. Today is the day she is supposed to take it and is wanting to know if she should. Please advise.

## 2022-03-19 NOTE — Telephone Encounter (Signed)
Pt states she has been having cough and congestion for 2 1/2 weeks. She was due for Humira last Sunday but held it. She was due yesterday and has not taken yet. She is still coughing up cream colored phlegm and wants to know what she should do. She has tried OTC cold/congestion meds, no antibiotics. She has not had a fever. Please advise.She knows Dr. Hilarie Fredrickson is off and will get a response tomorrow.

## 2022-03-20 NOTE — Telephone Encounter (Signed)
Spoke with pt and she is aware of Dr. Vena Rua recommendations. Pt is going to see if her PCP with order the lab and the xray at [facility] for her. Pt to call back and let us know.

## 2022-03-20 NOTE — Telephone Encounter (Signed)
I would say CBC and 2v CXR is she could come for this  Hold Humira until we see results and she feels better She should also let PCP know.  I have copied PCP.

## 2022-03-21 NOTE — Telephone Encounter (Signed)
Patient is calling states results from xray and blood tests should be in later today or tomorrow.

## 2022-03-23 NOTE — Telephone Encounter (Signed)
Pt reports she has not had a fever, she plans to restart the Humira on Saturday.

## 2022-03-23 NOTE — Telephone Encounter (Signed)
Reviewed, okay to resume Humira assuming feeling better and no fevers

## 2022-03-23 NOTE — Telephone Encounter (Signed)
Pt had chest xray and labs done at [facility]. Results are in care everywhere. She is wanting to know if ok to proceed with her Humira. Please advise.

## 2022-03-23 NOTE — Telephone Encounter (Signed)
Inbound call from patient inquiring about results and also when she can start taking Humira medication. Please advise.

## 2022-04-23 ENCOUNTER — Other Ambulatory Visit: Payer: Self-pay | Admitting: Internal Medicine

## 2022-05-10 ENCOUNTER — Telehealth: Payer: Self-pay | Admitting: *Deleted

## 2022-05-10 ENCOUNTER — Other Ambulatory Visit: Payer: Self-pay | Admitting: *Deleted

## 2022-05-10 DIAGNOSIS — K5100 Ulcerative (chronic) pancolitis without complications: Secondary | ICD-10-CM

## 2022-05-10 MED ORDER — NA SULFATE-K SULFATE-MG SULF 17.5-3.13-1.6 GM/177ML PO SOLN: ORAL | 0 refills | Status: DC

## 2022-05-10 NOTE — Telephone Encounter (Signed)
I have spoken to patient and scheduled colonoscopy for 07/19/22 at 10 am, 830 am arrival at [facility] endoscopy. She has been given prep instructions (suprep) via mychart, and sent via [employer]. She is advised of time/date/location of prep as well as to have care partner 47 or older to bring her, stay with her and take her home. She verbalizes understanding.

## 2022-05-10 NOTE — Telephone Encounter (Signed)
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 Needs done in Jan/Feb 2024. Call pt to explain. see office note 01/19/22

## 2022-07-10 NOTE — Progress Notes (Signed)
Unable to reach patient regarding pre op phone call. Left HIPAA compliant voicemail.

## 2022-07-12 ENCOUNTER — Other Ambulatory Visit: Payer: Self-pay

## 2022-07-12 ENCOUNTER — Encounter (HOSPITAL_COMMUNITY): Payer: Self-pay | Admitting: Internal Medicine

## 2022-07-12 ENCOUNTER — Encounter: Payer: Self-pay | Admitting: Internal Medicine

## 2022-07-18 ENCOUNTER — Telehealth: Payer: Self-pay

## 2022-07-18 NOTE — Telephone Encounter (Signed)
I spoke with Vicki Burton and informed her that her colonoscopy with Dr Zenovia Jarred at [facility] hospital has been moved up to 8:30AM so she needs to arrive at 7:00AM on 07/19/2022. She lives an hour away and said she will do the best she can. She will adjust her instructions for the new time.

## 2022-07-19 ENCOUNTER — Ambulatory Visit (HOSPITAL_COMMUNITY): Payer: BC Managed Care – PPO | Admitting: Certified Registered Nurse Anesthetist

## 2022-07-19 ENCOUNTER — Ambulatory Visit (HOSPITAL_COMMUNITY)
Admission: RE | Admit: 2022-07-19 | Discharge: 2022-07-19 | Disposition: A | Discharge status: Home / Self Care | Payer: BC Managed Care – PPO | Attending: Internal Medicine | Admitting: Internal Medicine

## 2022-07-19 ENCOUNTER — Other Ambulatory Visit: Payer: Self-pay

## 2022-07-19 ENCOUNTER — Encounter (HOSPITAL_COMMUNITY): Payer: Self-pay | Admitting: Internal Medicine

## 2022-07-19 ENCOUNTER — Encounter (HOSPITAL_COMMUNITY)
Admission: RE | Disposition: A | Discharge status: Home / Self Care | Payer: Self-pay | Source: Home / Self Care | Attending: Internal Medicine

## 2022-07-19 DIAGNOSIS — Z79899 Other long term (current) drug therapy: Secondary | ICD-10-CM | POA: Insufficient documentation

## 2022-07-19 DIAGNOSIS — Z6841 Body Mass Index (BMI) 40.0 and over, adult: Secondary | ICD-10-CM | POA: Diagnosis not present

## 2022-07-19 DIAGNOSIS — I10 Essential (primary) hypertension: Secondary | ICD-10-CM | POA: Diagnosis not present

## 2022-07-19 DIAGNOSIS — E669 Obesity, unspecified: Secondary | ICD-10-CM | POA: Diagnosis not present

## 2022-07-19 DIAGNOSIS — K5100 Ulcerative (chronic) pancolitis without complications: Secondary | ICD-10-CM | POA: Diagnosis not present

## 2022-07-19 DIAGNOSIS — Z1211 Encounter for screening for malignant neoplasm of colon: Secondary | ICD-10-CM | POA: Diagnosis present

## 2022-07-19 HISTORY — PX: COLONOSCOPY WITH PROPOFOL: SHX5780

## 2022-07-19 HISTORY — PX: BIOPSY: SHX5522

## 2022-07-19 HISTORY — DX: Cardiomegaly: I51.7

## 2022-07-19 HISTORY — DX: Prediabetes: R73.03

## 2022-07-19 SURGERY — COLONOSCOPY WITH PROPOFOL
Anesthesia: Monitor Anesthesia Care

## 2022-07-19 MED ORDER — PROPOFOL 10 MG/ML IV BOLUS
INTRAVENOUS | Status: DC | PRN
  Administered 2022-07-19: 30 mg via INTRAVENOUS

## 2022-07-19 MED ORDER — PROPOFOL 1000 MG/100ML IV EMUL
INTRAVENOUS | Status: AC
  Filled 2022-07-19: qty 100

## 2022-07-19 MED ORDER — PROPOFOL 500 MG/50ML IV EMUL
INTRAVENOUS | Status: DC | PRN
  Administered 2022-07-19: 125 ug/kg/min via INTRAVENOUS

## 2022-07-19 MED ORDER — LIDOCAINE 2% (20 MG/ML) 5 ML SYRINGE
INTRAMUSCULAR | Status: DC | PRN
  Administered 2022-07-19: 60 mg via INTRAVENOUS

## 2022-07-19 MED ORDER — LACTATED RINGERS IV SOLN: INTRAVENOUS | Status: DC

## 2022-07-19 SURGICAL SUPPLY — 22 items

## 2022-07-19 NOTE — Op Note (Signed)
[facility] Patient Name: Vicki Burton Procedure Date: 07/19/2022 MRN: [id number] Attending MD: Jerene Bears , MD, [id number] Date of Birth: 24-Apr-1965 CSN: [id number] Age: 57 Admit Type: Outpatient Procedure:                Colonoscopy Indications:              High risk colon cancer surveillance: Ulcerative                            pancolitis of 8 (or more) years duration, Last                            colonoscopy: October 2020; current therapy Humira                            40 mg every 14 days (pt reports clinical remission                            currently) Providers:                Lajuan Lines. Hilarie Fredrickson, MD, Vladimir Crofts, RN, William Dalton, Technician Referring MD:             Alvera Singh Medicines:                Monitored Anesthesia Care Complications:            No immediate complications. Estimated Blood Loss:     Estimated blood loss was minimal. Procedure:                Pre-Anesthesia Assessment:                           - Prior to the procedure, a History and Physical                            was performed, and patient medications and                            allergies were reviewed. The patient's tolerance of                            previous anesthesia was also reviewed. The risks                            and benefits of the procedure and the sedation                            options and risks were discussed with the patient.                            All questions were answered, and informed consent  was obtained. Prior Anticoagulants: The patient has                            taken no anticoagulant or antiplatelet agents. ASA                            Grade Assessment: III - A patient with severe                            systemic disease. After reviewing the risks and                            benefits, the patient was deemed in satisfactory                            condition  to undergo the procedure.                           After obtaining informed consent, the colonoscope                            was passed under direct vision. Throughout the                            procedure, the patient's blood pressure, pulse, and                            oxygen saturations were monitored continuously. The                            [id number]) Olympus colonoscope was                            introduced through the anus and advanced to the                            terminal ileum. The colonoscopy was performed                            without difficulty. The patient tolerated the                            procedure well. The quality of the bowel                            preparation was excellent. The terminal ileum,                            ileocecal valve, appendiceal orifice, and rectum                            were photographed. Scope In: 8:36:28 AM Scope Out: 8:47:13 AM Scope Withdrawal Time: 0 hours 9 minutes 29 seconds  Total Procedure Duration: 0 hours 10 minutes 45 seconds  Findings:      The digital rectal exam was normal.      The terminal ileum appeared normal.      Inflammation was found as short segment in the distal transverse colon       and near splenic flexure. This was graded as Mayo Score 1 (mild, with       erythema, decreased vascular pattern, mild friability). The remainder of       the colonic mucosa was normal and without inflammatory changes. Multiple       biopsies were obtained in the rectum/sigmoid colon/descending colon (Jar       3), in the distal transverse colon (Jar 2) and in the ascending       colon/proximal transverse colon (Jar 1) with cold forceps for histology.      The retroflexed view of the distal rectum and anal verge was normal and       showed no anal or rectal abnormalities. Impression:               - The examined portion of the ileum was normal.                           - Mild (Mayo Score 1)  ulcerative colitis in short                            segment of distal transverse colon (5-7 cm).                            Remainder of the colonic mucosa normal.                           - The distal rectum and anal verge are normal on                            retroflexion view.                           - Multiple biopsies were obtained in the rectum, in                            the sigmoid colon, in the descending colon, in the                            transverse colon and in the ascending colon. Moderate Sedation:      N/A Recommendation:           - Patient has a contact number available for                            emergencies. The signs and symptoms of potential                            delayed complications were discussed with the                            patient. Return to normal activities tomorrow.  Written discharge instructions were provided to the                            patient.                           - Resume previous diet.                           - Continue present medications. Okay to switch to                            biosimilar adalimumab 40 mg every 14 days if                            required by her insurance.                           - Await pathology results.                           - Repeat colonoscopy is recommended for                            surveillance. The colonoscopy date will be                            determined after pathology results from today's                            exam become available for review. Procedure Code(s):        --- Professional ---                           [phone], Colonoscopy, flexible; with biopsy, single                            or multiple Diagnosis Code(s):        --- Professional ---                           K51.00, Ulcerative (chronic) pancolitis without                            complications CPT copyright 2022 American Medical Association. All rights  reserved. The codes documented in this report are preliminary and upon coder review may  be revised to meet current compliance requirements. Jerene Bears, MD 07/19/2022 8:57:12 AM This report has been signed electronically. Number of Addenda: 0

## 2022-07-19 NOTE — Discharge Instructions (Signed)

## 2022-07-19 NOTE — H&P (Signed)
GASTROENTEROLOGY PROCEDURE H&P NOTE   Primary Care Physician: Alvera Singh, FNP    Reason for Procedure:   [facility] for > 8 yrs  Plan:    Surveillance colonoscopy  Patient is appropriate for endoscopic procedure(s) in the outpatient hospital setting.  The nature of the procedure, as well as the risks, benefits, and alternatives were carefully and thoroughly reviewed with the patient. Ample time for discussion and questions allowed. The patient understood, was satisfied, and agreed to proceed.     HPI: Vicki Burton is a 57 y.o. female who presents for colonoscopy.  Medical history as below.  Tolerated the prep.  No recent chest pain or shortness of breath.  No abdominal pain today.  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 took Nydrazid over 3 years ago pt staed 2024.   Ulcerative colitis, unspecified    Unspecified essential hypertension    Unspecified iridocyclitis    Vitamin B12 deficiency     Past Surgical History:  Procedure Laterality Date   ACHILLES TENDON REPAIR Left    BARTHOLIN GLAND CYST EXCISION  1992   BIOPSY  01/19/2019   Procedure: BIOPSY;  Surgeon: Jerene Bears, MD;  Location: [facility];  Service: Gastroenterology;;   COLONOSCOPY     COLONOSCOPY N/A 02/19/2014   Procedure: COLONOSCOPY;  Surgeon: Jerene Bears, MD;  Location: [facility];  Service: Gastroenterology;  Laterality: N/A;   COLONOSCOPY WITH PROPOFOL N/A 01/12/2016   Procedure: COLONOSCOPY WITH PROPOFOL;  Surgeon: Jerene Bears, MD;  Location: [facility];  Service: Gastroenterology;  Laterality: N/A;    COLONOSCOPY WITH PROPOFOL N/A 01/19/2019   Procedure: COLONOSCOPY WITH PROPOFOL;  Surgeon: Jerene Bears, MD;  Location: [facility];  Service: Gastroenterology;  Laterality: N/A;   cyst removed from back  07/2015    Prior to Admission medications   Medication Sig Start Date End Date Taking? Authorizing Provider  aspirin EC 81 MG tablet Take 81 mg by mouth at bedtime. Swallow whole.   Yes [provider]  atenolol (TENORMIN) 50 MG tablet Take 50 mg by mouth at bedtime.   Yes [provider]  Biotin 5000 MCG TABS Take 5,000 mcg by mouth daily.    Yes [provider]  cetirizine (ZYRTEC) 10 MG tablet Take 10 mg by mouth at bedtime. 04/04/16  Yes [provider]  chlorthalidone (HYGROTON) 25 MG tablet Take 25 mg by mouth at bedtime. 05/23/22  Yes [provider]  Flax Oil-Fish Oil-Borage Oil (FISH-FLAX-BORAGE) CAPS Take 1 capsule by mouth daily.   Yes [provider]  HUMIRA, 2 PEN, 40 MG/0.4ML PNKT INJECT 1 PEN UNDER THE SKIN EVERY 14 DAYS. 04/24/22  Yes Pritika Alvarez, Lajuan Lines, MD  Multiple Minerals-Vitamins (CAL-MAG-ZINC-D PO) Take 1 tablet by mouth daily.   Yes [provider]  Multiple Vitamins-Minerals (MULTIVITAMIN WITH MINERALS) tablet Take 1 tablet by mouth daily.   Yes [provider]  Na Sulfate-K Sulfate-Mg Sulf 17.5-3.13-1.6 GM/177ML SOLN Use as directed; may use generic; goodrx card if insurance will not cover generic  05/10/22  Yes Daylee Delahoz, Lajuan Lines, MD  polyethylene glycol powder (GLYCOLAX/MIRALAX) 17 GM/SCOOP powder Take 17 g by mouth daily as needed for mild constipation.    Yes [provider]  Semaglutide, 2 MG/DOSE, (OZEMPIC, 2 MG/DOSE,) 8 MG/3ML SOPN Inject 2 mg into the skin every Sunday. 12/21/20  Yes [provider]  vitamin B-12 (CYANOCOBALAMIN) 1000 MCG tablet Take 1,000 mcg by mouth daily.    Yes [provider]  Vitamin E 180 MG (400 UNIT) CAPS Take 400 Units by mouth daily.   Yes [provider]  albuterol (VENTOLIN HFA) 108 (90 Base) MCG/ACT inhaler Inhale 1 puff into the lungs every 4 (four) hours as needed for wheezing or shortness of breath. 01/28/20   [provider]  fluticasone (FLONASE) 50 MCG/ACT nasal spray Place 1 spray into both nostrils daily as needed for allergies.  11/02/13   [provider]    Current Facility-Administered Medications  Medication Dose Route Frequency Provider Last Rate Last Admin   lactated ringers infusion   Intravenous Continuous Marek Nghiem, Lajuan Lines, MD        Allergies as of 05/10/2022 - Review Complete 01/19/2022  Allergen Reaction Noted   Lisinopril Swelling and Cough 01/27/2014   Amlodipine Swelling 01/27/2014   Sulfonamide derivatives Hives     Family History  Problem Relation Age of Onset   Dementia Mother    Diabetes Father    Heart disease Father    Colon cancer Maternal Aunt    Colon cancer Other        maternal great Aunt   Rectal cancer Neg Hx    Stomach cancer Neg Hx    Esophageal cancer Neg Hx     Social History   Socioeconomic History   Marital status: Single    Spouse name: Not on file   Number of children: 0   Years of education: Not on file   Highest education level: Not on file  Occupational History   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   Occupation: Occupational hygienist  Tobacco Use   Smoking status: Never   Smokeless tobacco: Never  Vaping Use   Vaping Use: Never used  Substance and Sexual Activity   Alcohol use: No   Drug use: No   Sexual activity: Not Currently  Other Topics Concern   Not on file  Social History Narrative   Not on file   Social Determinants of Health   Financial Resource Strain: Not on file  Food Insecurity: Not on file  Transportation Needs: Not on file  Physical Activity: Not on file  Stress: Not on file  Social Connections: Not on file  Intimate Partner Violence: Not on file    Physical  Exam: Vital signs in last 24 hours: @BP  (!) 176/71   Pulse 73   Temp (!) 97 F (36.1 C) (Temporal)   Resp 14   Ht 5\' 2"  (1.575 m)   Wt (!) 139.3 kg   LMP 04/16/2018   SpO2 95%   BMI 56.17 kg/m  GEN: NAD EYE: Sclerae anicteric ENT: MMM CV: Non-tachycardic Pulm: CTA b/l GI: Soft, NT/ND NEURO:  Alert & Oriented x 3   Zenovia Jarred, MD [facility]  07/19/2022 8:08 AM

## 2022-07-19 NOTE — Anesthesia Postprocedure Evaluation (Signed)
Anesthesia Post Note  Patient: Vicki Burton  Procedure(s) Performed: COLONOSCOPY WITH PROPOFOL BIOPSY     Patient location during evaluation: PACU Anesthesia Type: MAC Level of consciousness: awake and alert Pain management: pain level controlled Vital Signs Assessment: post-procedure vital signs reviewed and stable Respiratory status: spontaneous breathing, nonlabored ventilation, respiratory function stable and patient connected to nasal cannula oxygen Cardiovascular status: stable and blood pressure returned to baseline Postop Assessment: no apparent nausea or vomiting Anesthetic complications: no  No notable events documented.  Last Vitals:  Vitals:   07/19/22 0900 07/19/22 0910  BP: 125/68 138/62  Pulse: 66 67  Resp: (!) 29 19  Temp:    SpO2: 98% 100%    Last Pain:  Vitals:   07/19/22 0910  TempSrc:   PainSc: 0-No pain                 Effie Berkshire

## 2022-07-19 NOTE — Transfer of Care (Signed)
Immediate Anesthesia Transfer of Care Note  Patient: Vicki Burton  Procedure(s) Performed: COLONOSCOPY WITH PROPOFOL BIOPSY  Patient Location: Endoscopy Unit  Anesthesia Type:MAC  Level of Consciousness: awake and patient cooperative  Airway & Oxygen Therapy: Patient Spontanous Breathing and Patient connected to nasal cannula oxygen  Post-op Assessment: Report given to RN and Post -op Vital signs reviewed and stable  Post vital signs: Reviewed and stable  Last Vitals:  Vitals Value Taken Time  BP 107/50 07/19/22 0854  Temp    Pulse 73 07/19/22 0854  Resp 20 07/19/22 0854  SpO2 98 % 07/19/22 0854    Last Pain:  Vitals:   07/19/22 0854  TempSrc:   PainSc: 0-No pain         Complications: No notable events documented.

## 2022-07-19 NOTE — Anesthesia Procedure Notes (Signed)
Procedure Name: MAC Date/Time: 07/19/2022 8:30 AM  Performed by: Claudia Desanctis, CRNAPre-anesthesia Checklist: Patient identified, Emergency Drugs available, Suction available and Patient being monitored Patient Re-evaluated:Patient Re-evaluated prior to induction Oxygen Delivery Method: Simple face mask Comments: Optifit nasal applied

## 2022-07-19 NOTE — Anesthesia Preprocedure Evaluation (Addendum)
Anesthesia Evaluation  Patient identified by MRN, date of birth, ID band Patient awake    Reviewed: Allergy & Precautions, NPO status , Patient's Chart, lab work & pertinent test results, reviewed documented beta blocker date and time   Airway Mallampati: I  TM Distance: >3 FB Neck ROM: Full    Dental  (+) Teeth Intact, Dental Advisory Given   Pulmonary neg pulmonary ROS   breath sounds clear to auscultation       Cardiovascular hypertension, Pt. on home beta blockers and Pt. on medications  Rhythm:Regular Rate:Normal     Neuro/Psych negative neurological ROS  negative psych ROS   GI/Hepatic Neg liver ROS, PUD,GERD  ,,  Endo/Other  negative endocrine ROS    Renal/GU negative Renal ROS  negative genitourinary   Musculoskeletal negative musculoskeletal ROS (+)    Abdominal  (+) + obese  Peds negative pediatric ROS (+)  Hematology negative hematology ROS (+)   Anesthesia Other Findings   Reproductive/Obstetrics negative OB ROS                              Anesthesia Physical Anesthesia Plan  ASA: 3  Anesthesia Plan: MAC   Post-op Pain Management: Minimal or no pain anticipated   Induction: Intravenous  PONV Risk Score and Plan: 0 and Propofol infusion  Airway Management Planned: Natural Airway and Simple Face Mask  Additional Equipment: None  Intra-op Plan:   Post-operative Plan:   Informed Consent: I have reviewed the patients History and Physical, chart, labs and discussed the procedure including the risks, benefits and alternatives for the proposed anesthesia with the patient or authorized representative who has indicated his/her understanding and acceptance.     Dental advisory given  Plan Discussed with: CRNA  Anesthesia Plan Comments:          Anesthesia Quick Evaluation

## 2022-07-20 LAB — SURGICAL PATHOLOGY

## 2022-07-22 ENCOUNTER — Encounter (HOSPITAL_COMMUNITY): Payer: Self-pay | Admitting: Internal Medicine

## 2022-07-24 ENCOUNTER — Other Ambulatory Visit: Payer: Self-pay

## 2022-07-24 DIAGNOSIS — K5100 Ulcerative (chronic) pancolitis without complications: Secondary | ICD-10-CM

## 2022-07-27 ENCOUNTER — Other Ambulatory Visit: Payer: BC Managed Care – PPO

## 2022-07-27 DIAGNOSIS — K5100 Ulcerative (chronic) pancolitis without complications: Secondary | ICD-10-CM

## 2022-08-03 LAB — SERIAL MONITORING

## 2022-08-04 LAB — ADALIMUMAB+AB (SERIAL MONITOR)
Adalimumab Drug Level: 7.9 ug/mL
Anti-Adalimumab Antibody: <25 ng/mL

## 2022-08-06 ENCOUNTER — Encounter: Payer: Self-pay | Admitting: Internal Medicine

## 2022-10-23 ENCOUNTER — Encounter: Payer: Self-pay | Admitting: Internal Medicine

## 2023-03-01 ENCOUNTER — Encounter: Payer: Self-pay | Admitting: Internal Medicine

## 2023-03-05 ENCOUNTER — Telehealth: Payer: Self-pay | Admitting: Internal Medicine

## 2023-03-05 NOTE — Telephone Encounter (Signed)
Pt called and states her pharmacy has told her that they faxed over 2 PA requests but have not gotten a PA back yet. Pt will take her last Humira pen tonight, she will be due for the next one in 14 days. Pt needs PA.

## 2023-03-05 NOTE — Telephone Encounter (Signed)
Inbound call from patient stating that she needs to speak with nurse regarding her refill on Humira. Please advise.

## 2023-03-07 ENCOUNTER — Other Ambulatory Visit (HOSPITAL_COMMUNITY): Payer: Self-pay

## 2023-03-07 ENCOUNTER — Telehealth: Payer: Self-pay | Admitting: Pharmacy Technician

## 2023-03-07 NOTE — Telephone Encounter (Signed)
Pharmacy Patient Advocate Encounter   Received notification from Fax that prior authorization for ADALIMUMAB-ADAZ [facility]) is required/requested.   Insurance verification completed.   The patient is insured through [employer] [facility] .   Per test claim: PA required; PA submitted to above mentioned insurance via CoverMyMeds Key/confirmation #/EOC [id number] Status is pending

## 2023-03-08 NOTE — Telephone Encounter (Signed)
Patient called and stated she wanted the status of her prior authorization for her medication. I informed the patient of the information below. Patient stated she will be speaking to her insurance.

## 2023-03-11 ENCOUNTER — Other Ambulatory Visit (HOSPITAL_COMMUNITY): Payer: Self-pay

## 2023-03-11 NOTE — Telephone Encounter (Signed)
Additional information was requested (has the patient achieved or maintained remission and medication requested via fax by patient insurance. Faxed supporting documents back to [phone].

## 2023-03-12 ENCOUNTER — Other Ambulatory Visit (HOSPITAL_COMMUNITY): Payer: Self-pay

## 2023-03-12 NOTE — Telephone Encounter (Signed)
Pharmacy Patient Advocate Encounter  Received notification from [employer] [facility] that Prior Authorization for HUMIRA 40MG  has been APPROVED from 11.26.24 to 11.26.25   PA #/Case ID/Reference #: [id number]

## 2023-07-09 ENCOUNTER — Other Ambulatory Visit: Payer: Self-pay | Admitting: Internal Medicine

## 2023-11-06 ENCOUNTER — Other Ambulatory Visit: Payer: Self-pay | Admitting: Internal Medicine

## 2024-04-01 ENCOUNTER — Telehealth: Payer: Self-pay

## 2024-04-01 ENCOUNTER — Other Ambulatory Visit (HOSPITAL_COMMUNITY): Payer: Self-pay

## 2024-04-01 NOTE — Telephone Encounter (Signed)
 PA request has been Started. New Encounter has been or will be created for follow up. For additional info see Pharmacy Prior Auth telephone encounter from 04/01/2024.

## 2024-04-01 NOTE — Telephone Encounter (Signed)
 Pharmacy Patient Advocate Encounter  Prior Authorization form/request asks a question that requires your assistance. Please see the question below and advise accordingly. The PA will not be submitted until the necessary information is received.

## 2024-04-01 NOTE — Telephone Encounter (Signed)
 Pharmacy Patient Advocate Encounter   Received notification from Pt Calls Messages that prior authorization for Adalimumab -adaz 40MG /0.4ML auto-injectors is required/requested.   Insurance verification completed.   The patient is insured through [employer] [facility].   Per test claim: PA required; PA started via CoverMyMeds. KEY [id number] . Waiting for clinical questions to populate.

## 2024-04-01 NOTE — Telephone Encounter (Signed)
 ERROR

## 2024-04-01 NOTE — Telephone Encounter (Signed)
 Patient is scheduled for an appointment on 04/02/24 with [facility], PA-C.

## 2024-04-01 NOTE — Telephone Encounter (Signed)
 Pt aware, scheduled to see [facility] PA 04/02/24@11am .

## 2024-04-01 NOTE — Telephone Encounter (Signed)
 Received fax that pt needs prior auth for adalimumab -adaz.

## 2024-04-02 ENCOUNTER — Ambulatory Visit: Admitting: Gastroenterology

## 2024-04-02 ENCOUNTER — Other Ambulatory Visit

## 2024-04-02 VITALS — BP 122/70 | HR 59 | Ht 62.0 in | Wt 320.1 lb

## 2024-04-02 DIAGNOSIS — K5190 Ulcerative colitis, unspecified, without complications: Secondary | ICD-10-CM | POA: Diagnosis not present

## 2024-04-02 DIAGNOSIS — H209 Unspecified iridocyclitis: Secondary | ICD-10-CM

## 2024-04-02 DIAGNOSIS — E538 Deficiency of other specified B group vitamins: Secondary | ICD-10-CM

## 2024-04-02 DIAGNOSIS — Z6841 Body Mass Index (BMI) 40.0 and over, adult: Secondary | ICD-10-CM

## 2024-04-02 DIAGNOSIS — K5100 Ulcerative (chronic) pancolitis without complications: Secondary | ICD-10-CM

## 2024-04-02 DIAGNOSIS — Z227 Latent tuberculosis: Secondary | ICD-10-CM

## 2024-04-02 LAB — COMPREHENSIVE METABOLIC PANEL WITH GFR
ALT: 42 U/L — ABNORMAL HIGH (ref 3–35)
AST: 23 U/L (ref 5–37)
Albumin: 4.1 g/dL (ref 3.5–5.2)
Alkaline Phosphatase: 54 U/L (ref 39–117)
BUN: 13 mg/dL (ref 6–23)
CO2: 30 meq/L (ref 19–32)
Calcium: 9.3 mg/dL (ref 8.4–10.5)
Chloride: 101 meq/L (ref 96–112)
Creatinine, Ser: 1.03 mg/dL (ref 0.40–1.20)
GFR: 59.02 mL/min — ABNORMAL LOW (ref >60.00)
Glucose, Bld: 112 mg/dL — ABNORMAL HIGH (ref 70–99)
Potassium: 3.8 meq/L (ref 3.5–5.1)
Sodium: 140 meq/L (ref 135–145)
Total Bilirubin: 0.4 mg/dL (ref 0.2–1.2)
Total Protein: 7.9 g/dL (ref 6.0–8.3)

## 2024-04-02 LAB — CBC WITH DIFFERENTIAL/PLATELET
Basophils Absolute: 0.1 K/uL (ref 0.0–0.1)
Basophils Relative: 1.7 % (ref 0.0–3.0)
Eosinophils Absolute: 0.2 K/uL (ref 0.0–0.7)
Eosinophils Relative: 4.7 % (ref 0.0–5.0)
HCT: 40.1 % (ref 36.0–46.0)
Hemoglobin: 13.3 g/dL (ref 12.0–15.0)
Lymphocytes Relative: 46.4 % — ABNORMAL HIGH (ref 12.0–46.0)
Lymphs Abs: 2.2 K/uL (ref 0.7–4.0)
MCHC: 33.2 g/dL (ref 30.0–36.0)
MCV: 94.4 fl (ref 78.0–100.0)
Monocytes Absolute: 0.5 K/uL (ref 0.1–1.0)
Monocytes Relative: 10.5 % (ref 3.0–12.0)
Neutro Abs: 1.7 K/uL (ref 1.4–7.7)
Neutrophils Relative %: 36.7 % — ABNORMAL LOW (ref 43.0–77.0)
Platelets: 280 K/uL (ref 150.0–400.0)
RBC: 4.25 Mil/uL (ref 3.87–5.11)
RDW: 14.8 % (ref 11.5–15.5)
WBC: 4.6 K/uL (ref 4.0–10.5)

## 2024-04-02 MED ORDER — ADALIMUMAB-ADAZ 40 MG/0.4ML ~~LOC~~ SOAJ: 1.0000 | SUBCUTANEOUS | 6 refills | Status: AC

## 2024-04-02 NOTE — Progress Notes (Signed)
 Chief Complaint: ulcerative colitis Primary GI MD:Dr. Albertus  HPI: 58 year old female with a history of ulcerative colitis diagnosed in 2001 (hepatic flexure to rectum, history of uveitis, B12 deficiency, morbid obesity, latent tuberculosis having completed isoniazid  therapy x9 months, who is here for follow-up.   She presents today for a medication refill for her pain similar to her.  She has a history of ulcerative colitis diagnosed in 2001.  Last colonoscopy in 2024 with activity in distal transverse colon with antibody trough negative.  She did not return fecal calprotectin.  She states she is doing well today has 1 soft formed bowel movement per day without blood without cramping.  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  - Mild ( Mayo Score 1) ulcerative colitis in short segment of distal transverse colon ( 5- 7 cm) . Remainder of the colonic mucosa normal.  - The distal rectum and anal verge are normal on retroflexion view.  - Multiple biopsies were obtained in the rectum, in the sigmoid colon, in the descending colon, in the transverse colon and in the ascending colon.  FINAL MICROSCOPIC DIAGNOSIS:   A. COLON, ASCENDING, TRANSVERSE, BIOPSY:  - Benign colonic mucosa with no specific pathologic changes  - Negative for features of chronicity or acute inflammation  - Negative for dysplasia or malignancy   B. COLON, DISTAL TRANSVERSE, BIOPSY:  - Chronic focally active colitis  - Negative for dysplasia or malignancy   C. COLON, SIGMOID/DESCENDING, BIOPSY:  - Benign colonic mucosa with no specific pathologic changes  - Negative for features of chronicity or acute inflammation  - Negative for dysplasia or malignancy   Past Medical History:  Diagnosis Date   Allergy    COVID-19    Enlarged heart    Family  history of malignant neoplasm of gastrointestinal tract    maternal great aunt   Iron deficiency anemia, unspecified    Obesity, unspecified    Other and unspecified noninfectious gastroenteritis and colitis(558.9)    Ovarian cyst    Pre-diabetes    TMJ (dislocation of temporomandibular joint)    Tuberculosis    latent TB, not active. took Nydrazid  over 3 years ago pt staed 2024.   Ulcerative colitis, unspecified    Unspecified essential hypertension    Unspecified iridocyclitis    Vitamin B12 deficiency     Past Surgical History:  Procedure Laterality Date   ACHILLES TENDON REPAIR Left    BARTHOLIN GLAND CYST EXCISION  1992   BIOPSY  01/19/2019   Procedure: BIOPSY;  Surgeon: Albertus Gordy HERO, MD;  Location: [facility];  Service: Gastroenterology;;   BIOPSY  07/19/2022   Procedure: BIOPSY;  Surgeon: Albertus Gordy HERO, MD;  Location: [facility];  Service: Gastroenterology;;   COLONOSCOPY     COLONOSCOPY N/A 02/19/2014   Procedure: COLONOSCOPY;  Surgeon: Gordy HERO Albertus, MD;  Location: [facility];  Service: Gastroenterology;  Laterality: N/A;   COLONOSCOPY WITH PROPOFOL  N/A 01/12/2016   Procedure: COLONOSCOPY WITH PROPOFOL ;  Surgeon: Gordy HERO Albertus, MD;  Location: [facility];  Service: Gastroenterology;  Laterality: N/A;   COLONOSCOPY WITH PROPOFOL  N/A 01/19/2019   Procedure: COLONOSCOPY WITH PROPOFOL ;  Surgeon: Albertus Gordy HERO, MD;  Location: [facility];  Service: Gastroenterology;  Laterality: N/A;   COLONOSCOPY WITH PROPOFOL  N/A 07/19/2022   Procedure: COLONOSCOPY  WITH PROPOFOL ;  Surgeon: Albertus Gordy HERO, MD;  Location: [facility];  Service: Gastroenterology;  Laterality: N/A;   cyst removed from back  07/2015    Current Outpatient Medications  Medication Sig Dispense Refill   albuterol (VENTOLIN HFA) 108 (90 Base) MCG/ACT inhaler Inhale 1 puff into the lungs every 4 (four) hours as needed for wheezing or shortness of breath.     aspirin EC 81 MG tablet Take 81 mg by mouth at bedtime.  Swallow whole.     atenolol (TENORMIN) 50 MG tablet Take 50 mg by mouth at bedtime.     Biotin 5000 MCG TABS Take 5,000 mcg by mouth daily.      cetirizine (ZYRTEC) 10 MG tablet Take 10 mg by mouth at bedtime.     chlorthalidone (HYGROTON) 25 MG tablet Take 25 mg by mouth at bedtime.     Flax Oil-Fish Oil-Borage Oil (FISH-FLAX-BORAGE) CAPS Take 1 capsule by mouth daily.     Multiple Minerals-Vitamins (CAL-MAG-ZINC-D PO) Take 1 tablet by mouth daily.     Multiple Vitamins-Minerals (MULTIVITAMIN WITH MINERALS) tablet Take 1 tablet by mouth daily.     polyethylene glycol powder (GLYCOLAX/MIRALAX) 17 GM/SCOOP powder Take 17 g by mouth daily as needed for mild constipation.      vitamin B-12 (CYANOCOBALAMIN ) 1000 MCG tablet Take 1,000 mcg by mouth daily.      Adalimumab -adaz 40 MG/0.4ML SOAJ Inject 1 Pen into the skin every 14 (fourteen) days. 0.4 mL 6   fluticasone (FLONASE) 50 MCG/ACT nasal spray Place 1 spray into both nostrils daily as needed for allergies.  (Patient not taking: Reported on 04/02/2024)     HUMIRA , 2 PEN, 40 MG/0.4ML PNKT INJECT 1 PEN UNDER THE SKIN EVERY 14 DAYS. 2 each 4   Semaglutide, 2 MG/DOSE, (OZEMPIC, 2 MG/DOSE,) 8 MG/3ML SOPN Inject 2 mg into the skin every Sunday.     Vitamin E 180 MG (400 UNIT) CAPS Take 400 Units by mouth daily.     No current facility-administered medications for this visit.    Allergies as of 04/02/2024 - Review Complete 04/02/2024  Allergen Reaction Noted   Lisinopril Swelling and Cough 01/27/2014   Amlodipine Swelling 01/27/2014   Sulfonamide derivatives Hives     Family History  Problem Relation Age of Onset   Dementia Mother    Diabetes Father    Heart disease Father    Colon cancer Maternal Aunt    Colon cancer Other        maternal great Aunt   Rectal cancer Neg Hx    Stomach cancer Neg Hx    Esophageal cancer Neg Hx     Social History   Socioeconomic History   Marital status: Single    Spouse name: Not on file   Number of  children: 0   Years of education: Not on file   Highest education level: Not on file  Occupational History   Occupation: Magazine Features Editor: [employer]    Comment: retired   Occupation: Occupational Hygienist  Tobacco Use   Smoking status: Never   Smokeless tobacco: Never  Vaping Use   Vaping status: Never Used  Substance and Sexual Activity   Alcohol use: No   Drug use: No   Sexual activity: Not Currently  Other Topics Concern   Not on file  Social History Narrative   Not on file   Social Drivers of Health   Tobacco Use: Low Risk (04/02/2024)   Patient  History    Smoking Tobacco Use: Never    Smokeless Tobacco Use: Never    Passive Exposure: Not on file  Financial Resource Strain: Not on file  Food Insecurity: No Food Insecurity (09/06/2023)   Received from [facility]   Epic    Within the past 12 months, you worried that your food would run out before you got the money to buy more.: Never true    Within the past 12 months, the food you bought just didn't last and you didn't have money to get more.: Never true  Transportation Needs: No Transportation Needs (09/06/2023)   Received from [facility] - Transportation    Lack of Transportation (Medical): No    Lack of Transportation (Non-Medical): No  Physical Activity: Not on file  Stress: Not on file  Social Connections: Not on file  Intimate Partner Violence: Not At Risk (07/28/2021)   Received from [facility]   Epic    Within the last year, have you been afraid of your partner or ex-partner?: No    Within the last year, have you been humiliated or emotionally abused in other ways by your partner or ex-partner?: No    Within the last year, have you been kicked, hit, slapped, or otherwise physically hurt by your partner or ex-partner?: No    Within the last year, have you been raped or forced to have any kind of sexual activity by your partner or ex-partner?: No  Depression  (PHQ2-9): Not on file  Alcohol Screen: Not on file  Housing: Not on file  Utilities: Low Risk (09/06/2023)   Received [facility]   Utilities    Within the past 12 months, have you been unable to get utilities(heat, electricity) when it was really needed?: No  Health Literacy: Not on file    Review of Systems:    Constitutional: No weight loss, fever, chills, weakness or fatigue HEENT: Eyes: No change in vision               Ears, Nose, Throat:  No change in hearing or congestion Skin: No rash or itching Cardiovascular: No chest pain, chest pressure or palpitations   Respiratory: No SOB or cough Gastrointestinal: See HPI and otherwise negative Genitourinary: No dysuria or change in urinary frequency Neurological: No headache, dizziness or syncope Musculoskeletal: No new muscle or joint pain Hematologic: No bleeding or bruising Psychiatric: No history of depression or anxiety    Physical Exam:  Vital signs: BP 122/70   Pulse (!) 59   Ht 5' 2 (1.575 m)   Wt (!) 320 lb 2 oz (145.2 kg)   LMP 04/16/2018   BMI 58.55 kg/m   Constitutional: NAD, alert and cooperative Head:  Normocephalic and atraumatic. Eyes:   PEERL, EOMI. No icterus. Conjunctiva pink. Respiratory: Respirations even and unlabored. Lungs clear to auscultation bilaterally.   No wheezes, crackles, or rhonchi.  Cardiovascular:  Regular rate and rhythm. No peripheral edema, cyanosis or pallor.  Gastrointestinal:  Soft, nondistended, nontender. No rebound or guarding. Normal bowel sounds. No appreciable masses or hepatomegaly. Rectal:  Declines Msk:  Symmetrical without gross deformities. Without edema, no deformity or joint abnormality.  Neurologic:  Alert and  oriented x4;  grossly normal neurologically.  Skin:   Dry and intact without significant lesions or rashes. Psychiatric: Oriented to person, place and time. Demonstrates good judgement and reason without abnormal affect or behaviors.  Physical  Exam    RELEVANT  LABS AND IMAGING: CBC    Component Value Date/Time   WBC 4.9 11/10/2021 1320   RBC 4.19 11/10/2021 1320   HGB 12.8 11/10/2021 1320   HCT 38.8 11/10/2021 1320   PLT 269.0 11/10/2021 1320   MCV 92.8 11/10/2021 1320   MCH 30.9 05/22/2018 1411   MCHC 33.0 11/10/2021 1320   RDW 14.4 11/10/2021 1320   LYMPHSABS 2.7 11/10/2021 1320   MONOABS 0.5 11/10/2021 1320   EOSABS 0.2 11/10/2021 1320   BASOSABS 0.0 11/10/2021 1320    CMP     Component Value Date/Time   NA 142 11/10/2021 1320   K 3.8 11/10/2021 1320   CL 107 11/10/2021 1320   CO2 28 11/10/2021 1320   GLUCOSE 123 (H) 11/10/2021 1320   BUN 14 11/10/2021 1320   CREATININE 1.06 11/10/2021 1320   CREATININE 0.91 02/04/2019 1527   CALCIUM 9.3 11/10/2021 1320   PROT 7.6 11/10/2021 1320   ALBUMIN 4.1 11/10/2021 1320   AST 20 11/10/2021 1320   ALT 25 11/10/2021 1320   ALKPHOS 56 11/10/2021 1320   BILITOT 0.4 11/10/2021 1320   GFRNONAA 71 02/04/2019 1527   GFRAA 82 02/04/2019 1527     Assessment/Plan:   58 year old female with a history of ulcerative colitis diagnosed in 2001 (hepatic flexure to rectum, history of uveitis, B12 deficiency, morbid obesity, latent tuberculosis having completed isoniazid  therapy x9 months, who is here for med refill  Ulcerative colitis, hepatic flexure to rectum, diagnosis 2001  Clinical remission on adalimumab -adaz (Humira  biosimilar) x 14 days. Last colonoscopy 2024 with focal area of activity in distal transverse. Negative antibody trough 07/2022 - fecal cal wasn't completed - Refill Humira  biosimilar 1 injection for 14 days - CBC/CMP/CMP - Fecal calprotectin --Annual flu shot recommended  - Due for repeat colonoscopy 07/2024 to be done in the hospital setting secondary to BMI over 50  Uveitis Resolved on Humira , follows with [facility]  History of latent TB S/p 9 months of isoniazid  therapy under ID  B12 deficiency On b12 supplement  BMI 58 Procedures to  be done in hospital setting  Ayeza Therriault Mollie RIGGERS [facility] 04/02/2024, 12:04 PM  Cc: Pablo Perkins, FNP

## 2024-04-02 NOTE — Patient Instructions (Addendum)
 _______________________________________________________  If your blood pressure at your visit was 140/90 or greater, please contact your primary care physician to follow up on this.  _______________________________________________________  If you are age 60 or older, your body mass index should be between 23-30. Your Body mass index is 58.55 kg/m. If this is out of the aforementioned range listed, please consider follow up with your Primary Care Provider.  If you are age 54 or younger, your body mass index should be between 19-25. Your Body mass index is 58.55 kg/m. If this is out of the aformentioned range listed, please consider follow up with your Primary Care Provider.   ________________________________________________________  The [facility] providers would like to encourage you to use MYCHART to communicate with providers for non-urgent requests or questions.  Due to long hold times on the telephone, sending your provider a message by [facility] may be a faster and more efficient way to get a response.  Please allow 48 business hours for a response.  Please remember that this is for non-urgent requests.  _______________________________________________________  [facility] is using a team-based approach to care.  Your team is made up of your doctor and two to three APPS. Our APPS (Nurse Practitioners and Physician Assistants) work with your physician to ensure care continuity for you. They are fully qualified to address your health concerns and develop a treatment plan. They communicate directly with your gastroenterologist to care for you. Seeing the Advanced Practice Practitioners on your physician's team can help you by facilitating care more promptly, often allowing for earlier appointments, access to diagnostic testing, procedures, and other specialty referrals.   Your provider has requested that you go to the basement level for lab work before leaving today. Press B on the  elevator. The lab is located at the first door on the left as you exit the elevator.  You will need a colonoscopy in April 2026 with Dr Albertus at [facility]  We will contact you to schedule.

## 2024-04-03 ENCOUNTER — Telehealth: Payer: Self-pay

## 2024-04-03 ENCOUNTER — Ambulatory Visit: Payer: Self-pay | Admitting: Gastroenterology

## 2024-04-03 NOTE — Telephone Encounter (Signed)
 Pharmacy Patient Advocate Encounter   Received notification from Pt Calls Messages that prior authorization for Adalimumab -adaz 40MG /0.4ML auto-injectors is required/requested.   Insurance verification completed.   The patient is insured through [facility].   Per test claim: PA required; PA started via CoverMyMeds. KEY [id number] . Waiting for clinical questions to populate.

## 2024-04-08 ENCOUNTER — Other Ambulatory Visit (HOSPITAL_COMMUNITY): Payer: Self-pay

## 2024-04-08 NOTE — Telephone Encounter (Signed)
 Pharmacy Patient Advocate Encounter   Questions for Adalimumab -adaz 40MG /0.4ML auto-injectors did not generate. Re-submitting.   Insurance verification completed.   The patient is insured through [facility].   Per test claim: PA required; PA started via CoverMyMeds. KEY [id number] . Waiting for clinical questions to populate.

## 2024-04-10 NOTE — Telephone Encounter (Signed)
 Pharmacy Patient Advocate Encounter    Per test claim: PA required; PA submitted to above mentioned insurance via Latent Key/confirmation #/EOC [id number] Status is pending

## 2024-04-13 ENCOUNTER — Other Ambulatory Visit: Payer: Self-pay

## 2024-04-13 ENCOUNTER — Other Ambulatory Visit (HOSPITAL_COMMUNITY): Payer: Self-pay

## 2024-04-13 NOTE — Telephone Encounter (Signed)
 Script was sent to [facility].

## 2024-04-13 NOTE — Telephone Encounter (Signed)
 Pharmacy Patient Advocate Encounter  Received notification from [facility] that Prior Authorization for Adalimumab -adaz 40MG /0.4ML auto-injectors has been APPROVED from 04-10-2024 to 04-10-2025  Cannot fill at [facility].   PA #/Case ID/Reference #: [id number]
